# Patient Record
Sex: Female | Born: 2000 | Race: White | Hispanic: No | Marital: Single | Smoking: Former smoker
Health system: Southern US, Community
[De-identification: ages and names within clinical notes are randomized; demographics above are authoritative.]

## PROBLEM LIST (undated history)

## (undated) DIAGNOSIS — F419 Anxiety disorder, unspecified: Secondary | ICD-10-CM

## (undated) DIAGNOSIS — J45909 Unspecified asthma, uncomplicated: Secondary | ICD-10-CM

## (undated) DIAGNOSIS — F4310 Post-traumatic stress disorder, unspecified: Secondary | ICD-10-CM

## (undated) DIAGNOSIS — F909 Attention-deficit hyperactivity disorder, unspecified type: Secondary | ICD-10-CM

## (undated) HISTORY — PX: WISDOM TOOTH EXTRACTION: SHX21

---

## 2020-07-12 ENCOUNTER — Other Ambulatory Visit: Payer: Self-pay

## 2020-07-12 ENCOUNTER — Emergency Department
Admission: EM | Admit: 2020-07-12 | Discharge: 2020-07-12 | Disposition: A | Discharge status: Home / Self Care | Payer: BC Managed Care – PPO | Attending: Emergency Medicine | Admitting: Emergency Medicine

## 2020-07-12 DIAGNOSIS — J0100 Acute maxillary sinusitis, unspecified: Secondary | ICD-10-CM | POA: Insufficient documentation

## 2020-07-12 DIAGNOSIS — R059 Cough, unspecified: Secondary | ICD-10-CM | POA: Diagnosis present

## 2020-07-12 MED ORDER — DOXYCYCLINE HYCLATE 50 MG PO CAPS: 100.0000 mg | ORAL_CAPSULE | Freq: Two times a day (BID) | ORAL | 0 refills | Status: AC

## 2020-07-12 NOTE — ED Provider Notes (Signed)
[facility] Emergency Department Provider Note  ____________________________________________  Time seen: Approximately 5:53 PM  I have reviewed the triage vital signs and the nursing notes.   HISTORY  Chief Complaint Cough and Facial Pain    HPI Rita Dunn is a 19 y.o. female that presents to the emergency department for evaluation of right-sided forehead and cheek pain, nasal congestion, sore throat, nonproductive cough.  Patient states that cough and nasal congestion has been present for about 1 month.  Her facial pain started today.  She tested negative for Covid today.  No fevers, headache, neck pain, shortness of breath, chest pain.   No past medical history on file.  There are no problems to display for this patient.    Prior to Admission medications   Medication Sig Start Date End Date Taking? Authorizing Provider  doxycycline (VIBRAMYCIN) 50 MG capsule Take 2 capsules (100 mg total) by mouth 2 (two) times daily for 10 days. 07/12/20 07/22/20  Enid Derry, PA-C    Allergies Penicillins  No family history on file.  Social History Social History   Tobacco Use  . Smoking status: Not on file  Substance Use Topics  . Alcohol use: Not on file  . Drug use: Not on file     Review of Systems  Constitutional: No fever/chills Eyes: No visual changes. No discharge. ENT: Positive for congestion and rhinorrhea.  Positive for facial pain. Cardiovascular: No chest pain. Respiratory: Positive for cough. No SOB. Gastrointestinal: No abdominal pain.  No nausea, no vomiting.  No diarrhea.  No constipation. Musculoskeletal: Negative for musculoskeletal pain. Skin: Negative for rash, abrasions, lacerations, ecchymosis. Neurological: Negative for headaches.   ____________________________________________   PHYSICAL EXAM:  VITAL SIGNS: ED Triage Vitals  Enc Vitals Group     BP 07/12/20 1514 112/63     Pulse Rate 07/12/20 1514 79     Resp  07/12/20 1514 16     Temp 07/12/20 1514 98.9 F (37.2 C)     Temp Source 07/12/20 1514 Oral     SpO2 07/12/20 1514 99 %     Weight 07/12/20 1515 120 lb (54.4 kg)     Height 07/12/20 1515 5\' 6"  (1.676 m)     Head Circumference --      Peak Flow --      Pain Score 07/12/20 1515 6     Pain Loc --      Pain Edu? --      Excl. in GC? --      Constitutional: Alert and oriented. Well appearing and in no acute distress. Eyes: Conjunctivae are normal. PERRL. EOMI. No discharge. Head: Atraumatic. ENT: Right frontal and maxillary sinus tenderness.      Ears: Tympanic membranes pearly gray with good landmarks. No discharge.      Nose: Mild congestion/rhinnorhea.      Mouth/Throat: Mucous membranes are moist. Oropharynx non-erythematous. Tonsils not enlarged. No exudates. Uvula midline. Neck: No stridor.   Hematological/Lymphatic/Immunilogical: No cervical lymphadenopathy. Cardiovascular: Normal rate, regular rhythm.  Good peripheral circulation. Respiratory: Normal respiratory effort without tachypnea or retractions. Lungs CTAB. Good air entry to the bases with no decreased or absent breath sounds. Gastrointestinal: Bowel sounds 4 quadrants. Soft and nontender to palpation. No guarding or rigidity. No palpable masses. No distention. Musculoskeletal: Full range of motion to all extremities. No gross deformities appreciated. Neurologic:  Normal speech and language. No gross focal neurologic deficits are appreciated.  Skin:  Skin is warm, dry and intact. No rash noted.  Psychiatric: Mood and affect are normal. Speech and behavior are normal. Patient exhibits appropriate insight and judgement.   ____________________________________________   LABS (all labs ordered are listed, but only abnormal results are displayed)  Labs Reviewed - No data to display ____________________________________________  EKG   ____________________________________________  RADIOLOGY   No results  found.  ____________________________________________    PROCEDURES  Procedure(s) performed:    Procedures    Medications - No data to display   ____________________________________________   INITIAL IMPRESSION / ASSESSMENT AND PLAN / ED COURSE  Pertinent labs & imaging results that were available during my care of the patient were reviewed by me and considered in my medical decision making (see chart for details).  Review of the [place] CSRS was performed in accordance of the [facility] prior to dispensing any controlled drugs.     Patient's diagnosis is consistent with sinusitis Vital signs and exam are reassuring.  Patient feels comfortable going home. Patient will be discharged home with prescriptions for doxycycline. Patient is to follow up with PCP as needed or otherwise directed. Patient is given ED precautions to return to the ED for any worsening or new symptoms.   Rita Dunn was evaluated in Emergency Department on 07/12/2020 for the symptoms described in the history of present illness. She was evaluated in the context of the global COVID-19 pandemic, which necessitated consideration that the patient might be at risk for infection with the SARS-CoV-2 virus that causes COVID-19. Institutional protocols and algorithms that pertain to the evaluation of patients at risk for COVID-19 are in a state of rapid change based on information released by regulatory bodies including the CDC and federal and state organizations. These policies and algorithms were followed during the patient's care in the ED.  ____________________________________________  FINAL CLINICAL IMPRESSION(S) / ED DIAGNOSES  Final diagnoses:  Acute non-recurrent maxillary sinusitis      NEW MEDICATIONS STARTED DURING THIS VISIT:  ED Discharge Orders         Ordered    doxycycline (VIBRAMYCIN) 50 MG capsule  2 times daily        07/12/20 1750              This chart was dictated using voice recognition  software/Dragon. Despite best efforts to proofread, errors can occur which can change the meaning. Any change was purely unintentional.    Enid Derry, PA-C 07/12/20 2250    Willy Eddy, MD 07/17/20 [phone]

## 2020-07-12 NOTE — ED Triage Notes (Signed)
Pt here with cough that started a month ago. Pt also states that she has facial pain that started today. Pt went to her health center at [facility] and was sent here to be seen. Pt also has been having N/V. Pt denies being around with covid and tested negative today as well. Pt NAD in triage.

## 2020-07-12 NOTE — ED Triage Notes (Signed)
Pt called for triage, no response. 

## 2020-11-12 ENCOUNTER — Emergency Department
Admission: EM | Admit: 2020-11-12 | Discharge: 2020-11-12 | Disposition: A | Discharge status: Home / Self Care | Payer: BC Managed Care – PPO | Attending: Emergency Medicine | Admitting: Emergency Medicine

## 2020-11-12 ENCOUNTER — Other Ambulatory Visit: Payer: Self-pay

## 2020-11-12 ENCOUNTER — Emergency Department: Payer: BC Managed Care – PPO

## 2020-11-12 DIAGNOSIS — R1310 Dysphagia, unspecified: Secondary | ICD-10-CM | POA: Insufficient documentation

## 2020-11-12 DIAGNOSIS — R221 Localized swelling, mass and lump, neck: Secondary | ICD-10-CM | POA: Diagnosis present

## 2020-11-12 DIAGNOSIS — J039 Acute tonsillitis, unspecified: Secondary | ICD-10-CM

## 2020-11-12 DIAGNOSIS — J45909 Unspecified asthma, uncomplicated: Secondary | ICD-10-CM | POA: Diagnosis not present

## 2020-11-12 HISTORY — DX: Unspecified asthma, uncomplicated: J45.909

## 2020-11-12 LAB — CHLAMYDIA/NGC RT PCR (ARMC ONLY)
Chlamydia Tr: NOT DETECTED
N gonorrhoeae: NOT DETECTED

## 2020-11-12 LAB — CBC WITH DIFFERENTIAL/PLATELET
Abs Immature Granulocytes: 0.02 10*3/uL (ref 0.00–0.07)
Basophils Absolute: 0.1 10*3/uL (ref 0.0–0.1)
Basophils Relative: 1 %
Eosinophils Absolute: 0.2 10*3/uL (ref 0.0–0.5)
Eosinophils Relative: 2 %
HCT: 42.2 % (ref 36.0–46.0)
Hemoglobin: 14.1 g/dL (ref 12.0–15.0)
Immature Granulocytes: 0 %
Lymphocytes Relative: 35 %
Lymphs Abs: 3.2 10*3/uL (ref 0.7–4.0)
MCH: 29.3 pg (ref 26.0–34.0)
MCHC: 33.4 g/dL (ref 30.0–36.0)
MCV: 87.6 fL (ref 80.0–100.0)
Monocytes Absolute: 0.7 10*3/uL (ref 0.1–1.0)
Monocytes Relative: 8 %
Neutro Abs: 5 10*3/uL (ref 1.7–7.7)
Neutrophils Relative %: 54 %
Platelets: 199 10*3/uL (ref 150–400)
RBC: 4.82 MIL/uL (ref 3.87–5.11)
RDW: 12.9 % (ref 11.5–15.5)
WBC: 9.1 10*3/uL (ref 4.0–10.5)
nRBC: 0 % (ref 0.0–0.2)

## 2020-11-12 LAB — COMPREHENSIVE METABOLIC PANEL
ALT: 20 U/L (ref 0–44)
AST: 21 U/L (ref 15–41)
Albumin: 4.8 g/dL (ref 3.5–5.0)
Alkaline Phosphatase: 61 U/L (ref 38–126)
Anion gap: 11 (ref 5–15)
BUN: 10 mg/dL (ref 6–20)
CO2: 23 mmol/L (ref 22–32)
Calcium: 9.7 mg/dL (ref 8.9–10.3)
Chloride: 103 mmol/L (ref 98–111)
Creatinine, Ser: 0.67 mg/dL (ref 0.44–1.00)
GFR, Estimated: >60 mL/min (ref >60)
Glucose, Bld: 93 mg/dL (ref 70–99)
Potassium: 3.7 mmol/L (ref 3.5–5.1)
Sodium: 137 mmol/L (ref 135–145)
Total Bilirubin: 0.6 mg/dL (ref 0.3–1.2)
Total Protein: 8.6 g/dL — ABNORMAL HIGH (ref 6.5–8.1)

## 2020-11-12 LAB — GROUP A STREP BY PCR: Group A Strep by PCR: NOT DETECTED

## 2020-11-12 LAB — MONONUCLEOSIS SCREEN: Mono Screen: NEGATIVE

## 2020-11-12 LAB — POC URINE PREG, ED: Preg Test, Ur: NEGATIVE

## 2020-11-12 IMAGING — CT CT NECK W/ CM
3 of 4 series · 14 of 35 positions shown, 17 images · IV contrast (omnipaque)
Comparison: None.

CLINICAL DATA: Neck abscess.  Swollen tonsils.

EXAM:
CT NECK WITH CONTRAST
TECHNIQUE: Multidetector CT imaging of the neck was performed using the
standard protocol following the bolus administration of intravenous
contrast.
CONTRAST:  75mL OMNIPAQUE IOHEXOL 300 MG/ML  SOLN

[Series 5: sag neck · sagittal · 0.44mm/px · 5 of 104 slices shown, 6 images]
[im 35/104  bone]
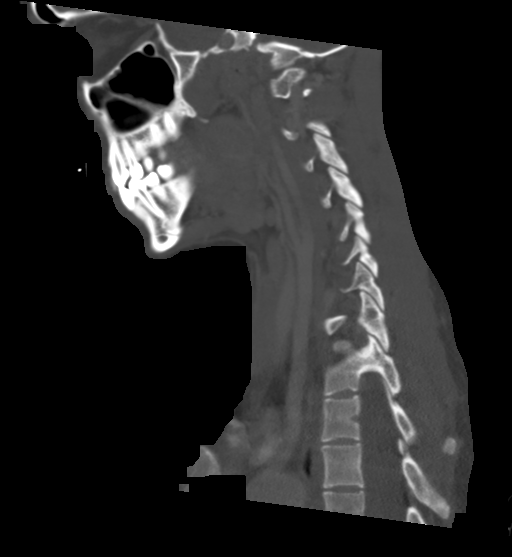
[im 43/104  bone]
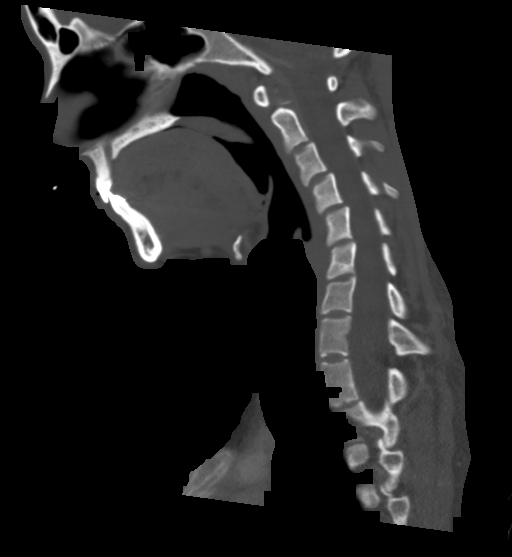
[im 52/104  soft-tissue]
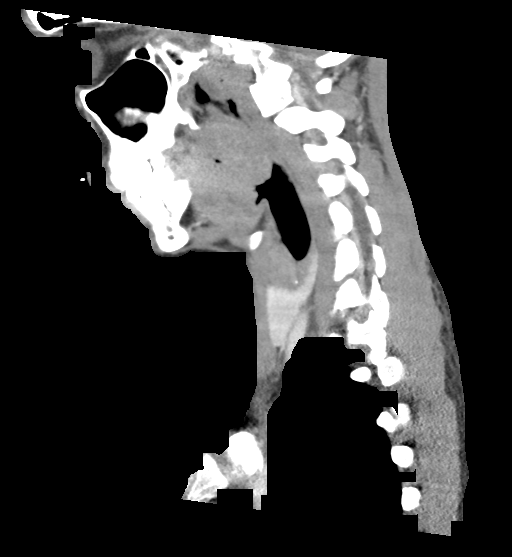
[im 52/104  bone]
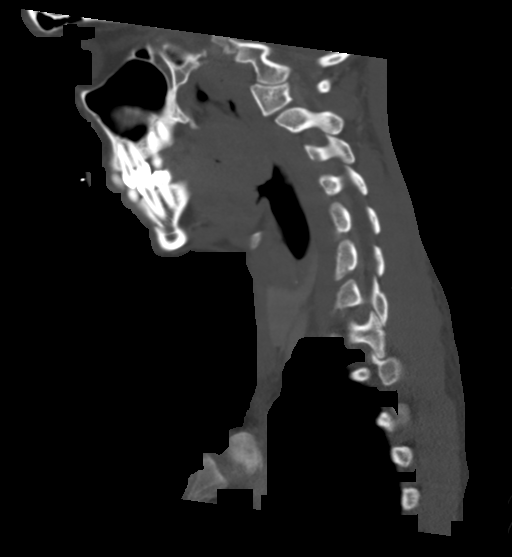
[im 61/104  bone]
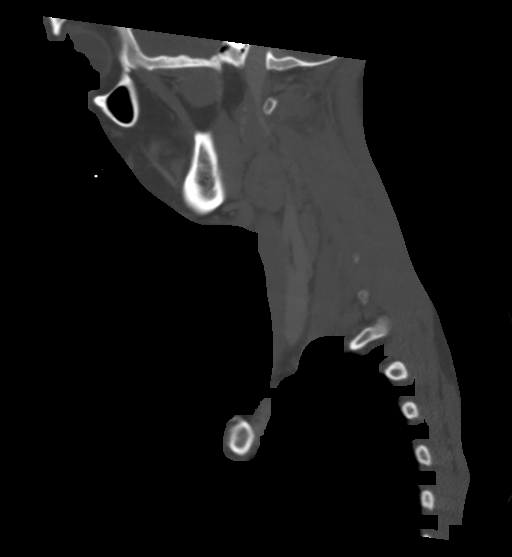
[im 69/104  bone]
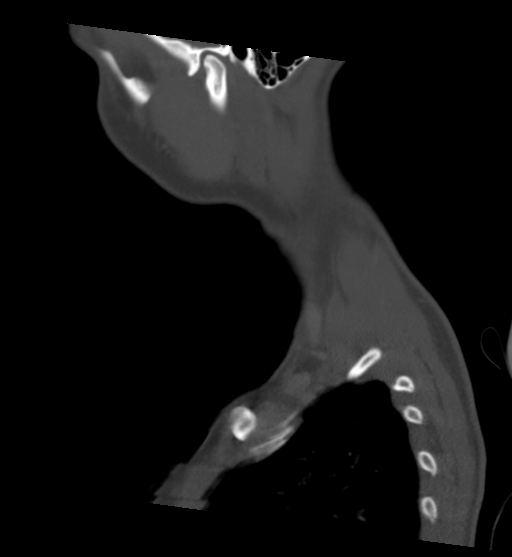

[Series 6: cor neck · coronal · 0.42mm/px · 3 of 120 slices shown]
[im 24/120  bone]
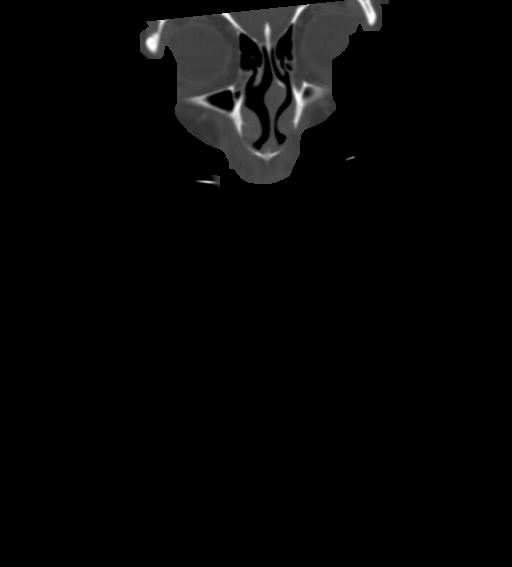
[im 48/120  bone]
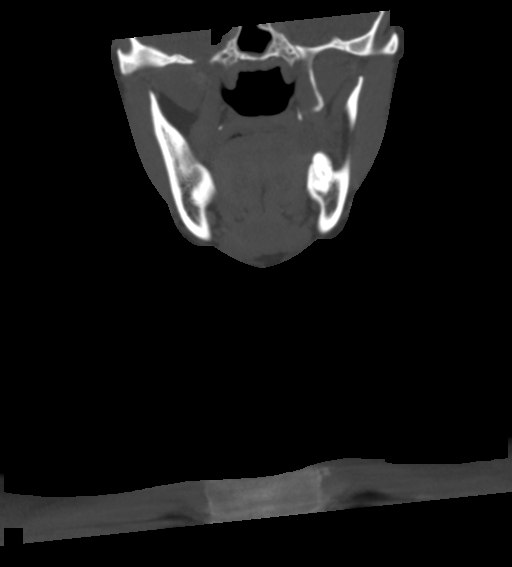
[im 72/120  bone]
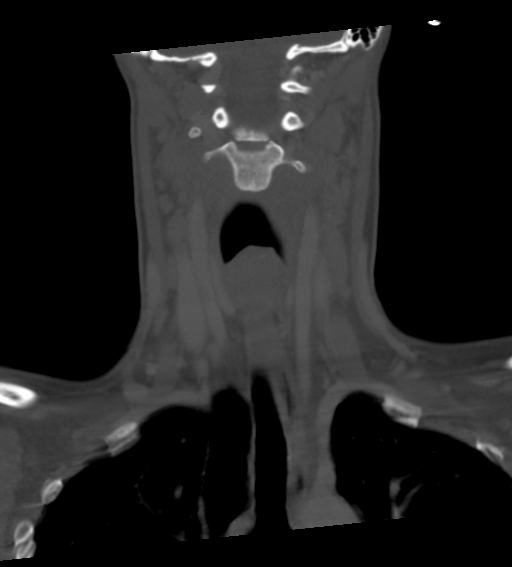

[Series 7: orthogonal ax · axial · 0.33mm/px · z∈[-285,-113]mm · 6 of 124 slices shown, 8 images]
[im 18/124  soft-tissue]
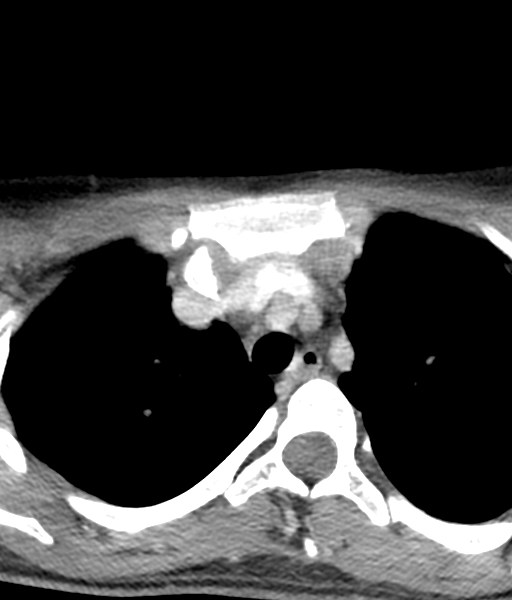
[im 18/124  bone]
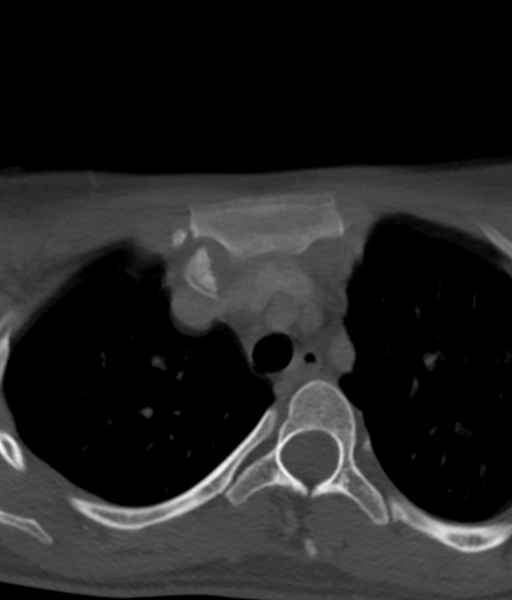
[im 36/124  bone]
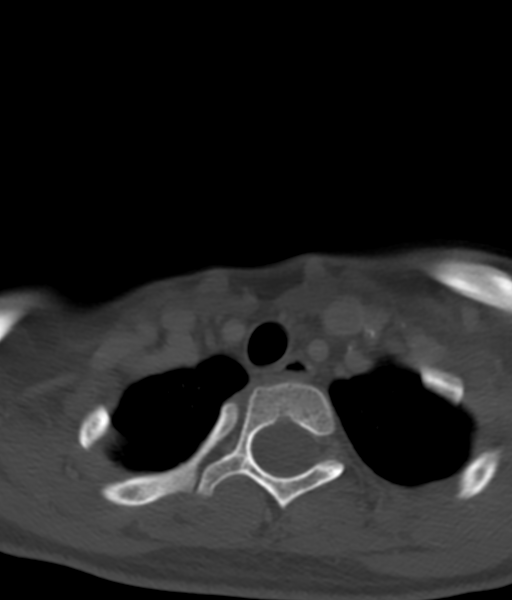
[im 53/124  bone]
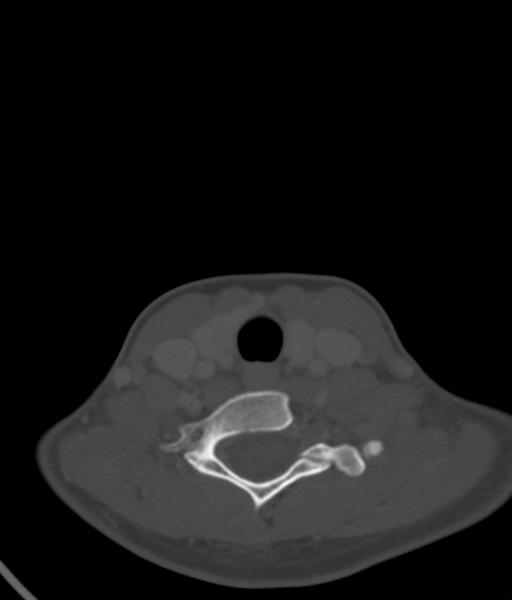
[im 71/124  bone]
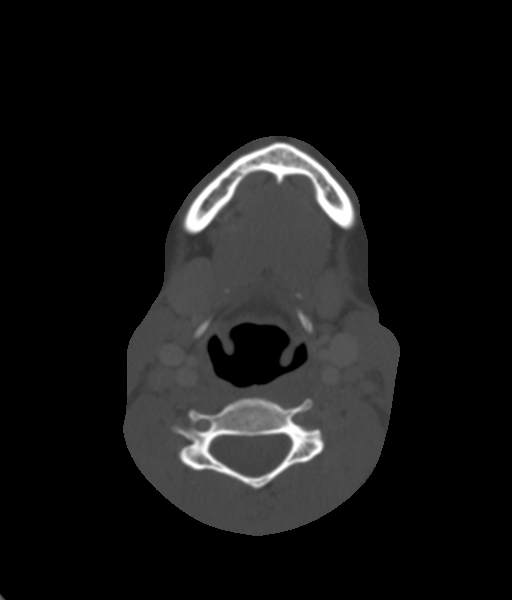
[im 88/124  soft-tissue]
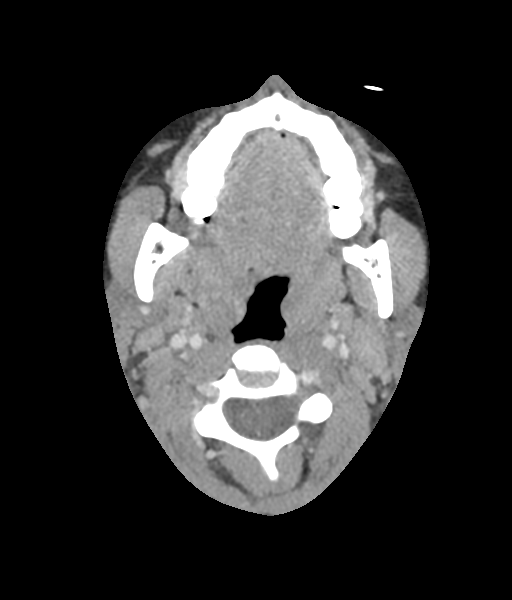
[im 88/124  bone]
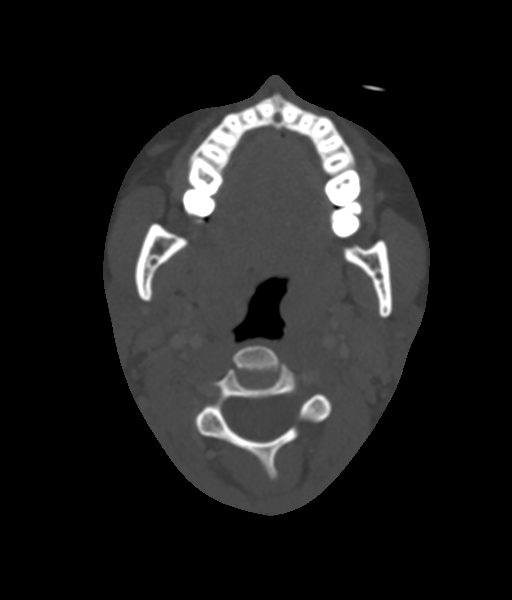
[im 106/124  bone]
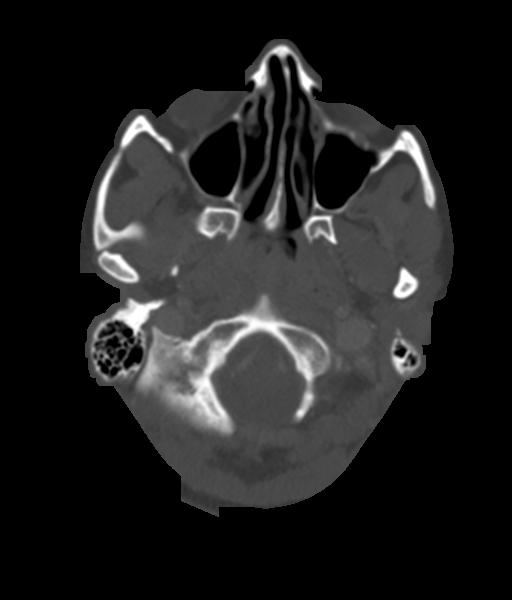

[14 of 35 positions shown; findings below may reference images not displayed]

FINDINGS: Pharynx and larynx: Moderate bilateral tonsillar prominence that
could go along with mild tonsillitis. No advanced inflammatory
change. No tonsillar or peritonsillar abscess. No other mucosal or
submucosal finding.

Salivary glands: Parotid and submandibular glands are normal.

Thyroid: Normal

Lymph nodes: Prominent level 2 nodes on both sides likely reactive
to tonsillar inflammation. No suppuration.

Vascular: Normal

Limited intracranial: Normal

Visualized orbits: Normal

Mastoids and visualized paranasal sinuses: Clear

Skeleton: Normal

Upper chest: Normal

Other: None
IMPRESSION: Moderate bilateral tonsillar prominence that could go along with
mild tonsillitis. No advanced inflammatory change. No tonsillar or
peritonsillar abscess. Prominent level 2 nodes on both sides likely
reactive to tonsillar inflammation. No suppuration.

## 2020-11-12 MED ORDER — AZITHROMYCIN 250 MG PO TABS
250.0000 mg | ORAL_TABLET | Freq: Every day | ORAL | 0 refills | Status: AC
Start: 2020-11-13 — End: 2020-11-17

## 2020-11-12 MED ORDER — DEXAMETHASONE SODIUM PHOSPHATE 10 MG/ML IJ SOLN
10.0000 mg | Freq: Once | INTRAMUSCULAR | Status: AC
  Administered 2020-11-12: 10 mg via INTRAVENOUS
  Filled 2020-11-12: qty 1

## 2020-11-12 MED ORDER — HYDROXYZINE HCL 25 MG PO TABS
25.0000 mg | ORAL_TABLET | Freq: Once | ORAL | Status: AC
  Administered 2020-11-12: 25 mg via ORAL
  Filled 2020-11-12: qty 1

## 2020-11-12 MED ORDER — AZITHROMYCIN 500 MG PO TABS
500.0000 mg | ORAL_TABLET | Freq: Once | ORAL | Status: AC
  Administered 2020-11-12: 500 mg via ORAL
  Filled 2020-11-12: qty 1

## 2020-11-12 MED ORDER — PREDNISONE 20 MG PO TABS: 20.0000 mg | ORAL_TABLET | Freq: Two times a day (BID) | ORAL | 0 refills | Status: AC

## 2020-11-12 MED ORDER — IOHEXOL 300 MG/ML  SOLN
75.0000 mL | Freq: Once | INTRAMUSCULAR | Status: AC | PRN
  Administered 2020-11-12: 75 mL via INTRAVENOUS
  Filled 2020-11-12: qty 75

## 2020-11-12 NOTE — ED Notes (Signed)
Patient returned to room from CT. 

## 2020-11-12 NOTE — ED Notes (Signed)
Patient to CT.

## 2020-11-12 NOTE — ED Notes (Signed)
ED Provider at bedside. 

## 2020-11-12 NOTE — ED Triage Notes (Signed)
Pt presents via POV c/o sore throat for a couple of days. Reports "tonsil swelling".

## 2020-11-12 NOTE — Discharge Instructions (Addendum)
Your CT scan was reassuring as it did not show any abscess related to your tonsillitis.  You do have bilateral tonsillitis which would be treated with both antibiotics and steroids.  You have a mono test pending at the time of this disposition and you may follow that result on [facility] MyChart.  Take the antibiotic and steroid as directed until all pills are completed.  Avoid taking any over-the-counter anti-inflammatories until the steroids are completed.  Continue to hydrate to prevent dehydration.  Follow-up with your primary provider for ongoing symptoms.

## 2020-11-12 NOTE — ED Notes (Signed)
Patient is resting comfortably. 

## 2020-11-12 NOTE — ED Notes (Signed)
Patient updated on POC. Patient denies concerns, complaints, or questions at this time.

## 2020-11-12 NOTE — ED Provider Notes (Signed)
[facility] Emergency Department Provider Note  ____________________________________________   Event Date/Time   First MD Initiated Contact with Patient 11/12/20 1748     (approximate)  I have reviewed the triage vital signs and the nursing notes.   HISTORY  Chief Complaint Sore Throat  HPI Rita Dunn is a 20 y.o. female reports to the emergency department for evaluation of feeling like her tonsil was swollen.  Patient states that she was seen 3 to 4 weeks ago by an outpatient provider and states that she was not tested, but was treated for strep throat.  She states that she only took 3 doses of the antibiotic because she was concerned about potential for allergy given that she has been told to avoid penicillins.  She is not sure the exact antibiotic that she was initially prescribed, but admits that she did not take anything other than the 3 initial doses.  She states that after that time, she was asymptomatic until 3 days ago, when she developed swelling on the right side of her throat.  She states that she has difficulty swallowing food, and it feels like there is something blocking her throat from breathing.  She denies any current pain in her throat, denies fevers, states that she otherwise feels well.  She denies any voice changes.  Has tried Tylenol at home without any relief.         Past Medical History:  Diagnosis Date  . Asthma     There are no problems to display for this patient.   History reviewed. No pertinent surgical history.  Prior to Admission medications   Not on File    Allergies Penicillins  History reviewed. No pertinent family history.  Social History Social History   Tobacco Use  . Smoking status: Never Smoker  . Smokeless tobacco: Never Used  Substance Use Topics  . Alcohol use: Never  . Drug use: Never    Review of Systems Constitutional: No fever/chills Eyes: No visual changes. ENT: + Throat  swelling Cardiovascular: Denies chest pain. Respiratory: Denies shortness of breath. Gastrointestinal: No abdominal pain.  No nausea, no vomiting.  No diarrhea.  No constipation. Genitourinary: Negative for dysuria. Musculoskeletal: Negative for back pain. Skin: Negative for rash. Neurological: Negative for headaches, focal weakness or numbness.   ____________________________________________   PHYSICAL EXAM:  VITAL SIGNS: ED Triage Vitals  Enc Vitals Group     BP 11/12/20 1736 127/77     Pulse Rate 11/12/20 1736 92     Resp 11/12/20 1736 16     Temp 11/12/20 1736 98.2 F (36.8 C)     Temp Source 11/12/20 1736 Oral     SpO2 11/12/20 1736 99 %     Weight 11/12/20 1804 121 lb 4.1 oz (55 kg)     Height 11/12/20 1804 5\' 6"  (1.676 m)     Head Circumference --      Peak Flow --      Pain Score 11/12/20 1729 0     Pain Loc --      Pain Edu? --      Excl. in GC? --    Constitutional: Alert and oriented. Well appearing and in no acute distress. Eyes: Conjunctivae are normal. PERRL. EOMI. Head: Atraumatic. Nose: No congestion/rhinnorhea. Mouth/Throat: Mucous membranes are moist.  Oropharynx is erythematous with bilateral tonsillar exudates.  The left tonsil appears 1+, right tonsil appears 3+ with a uvula shift towards the left.  Airway remains patent.  Voice sounds  are normal. Neck: No stridor.  There is tenderness to palpation of the right submandibular area.  Lymphatic: There is right-sided cervical lymphadenopathy, but none on the left Cardiovascular: Normal rate, regular rhythm. Grossly normal heart sounds.  Good peripheral circulation. Respiratory: Normal respiratory effort.  No retractions. Lungs CTAB. Gastrointestinal: Soft and nontender. No distention.  Neurologic:  Normal speech and language. No gross focal neurologic deficits are appreciated. No gait instability. Skin:  Skin is warm, dry and intact. No rash noted. Psychiatric: Mood and affect are normal. Speech and  behavior are normal.  ____________________________________________   LABS (all labs ordered are listed, but only abnormal results are displayed)  Labs Reviewed  GROUP A STREP BY PCR  CHLAMYDIA/NGC RT PCR ([facility] ONLY)  CBC WITH DIFFERENTIAL/PLATELET  COMPREHENSIVE METABOLIC PANEL  POC URINE PREG, ED   ____________________________________________   INITIAL IMPRESSION / ASSESSMENT AND PLAN / ED COURSE  As part of my medical decision making, I reviewed the following data within the electronic MEDICAL RECORD NUMBER Nursing notes reviewed and incorporated        Patient is a 20 year old female who presents to the emergency department today for evaluation of right-sided throat swelling that has been present for 3 days.  She had what sounds to be incompletely treated strep pharyngitis approximately 3 to 4 weeks ago.  She denies any throat pain, denies any voice changes, but does report feeling like there is something blocking her from breathing and increasing difficulty swallowing food.  See HPI for further details.  In triage, the patient has normal vital signs, is not febrile or tachycardic.  On physical exam, she does have a notable unilateral tonsillar swelling with exudate as well as a uvular shift to the left.  Will initiate further evaluation with labs, strep and GC/chlamydia swabs of the throat, as well as CT with contrast of the soft tissues of the neck.  At this time, the patient is being signed out to [facility], PA-C pending laboratory evaluation and CT.      ____________________________________________   FINAL CLINICAL IMPRESSION(S) / ED DIAGNOSES  Final diagnoses:  None     ED Discharge Orders    None      *Please note:  Rita Dunn was evaluated in Emergency Department on 11/12/2020 for the symptoms described in the history of present illness. She was evaluated in the context of the global COVID-19 pandemic, which necessitated consideration that the patient  might be at risk for infection with the SARS-CoV-2 virus that causes COVID-19. Institutional protocols and algorithms that pertain to the evaluation of patients at risk for COVID-19 are in a state of rapid change based on information released by regulatory bodies including the CDC and federal and state organizations. These policies and algorithms were followed during the patient's care in the ED.  Some ED evaluations and interventions may be delayed as a result of limited staffing during and the pandemic.*   Note:  This document was prepared using Dragon voice recognition software and may include unintentional dictation errors.   Lucy Chris, PA 11/12/20 Teola Bradley, MD 11/12/20 [phone]

## 2020-11-12 NOTE — ED Notes (Signed)
Patient reports redness, and swelling to throat x several days. Patient reports some SOB and pain with swallowing. Patient reports nausea, but states she has nausea intermittently at baseline.

## 2020-12-13 ENCOUNTER — Emergency Department: Payer: BC Managed Care – PPO

## 2020-12-13 ENCOUNTER — Emergency Department
Admission: EM | Admit: 2020-12-13 | Discharge: 2020-12-13 | Disposition: A | Discharge status: Home / Self Care | Payer: BC Managed Care – PPO | Attending: Emergency Medicine | Admitting: Emergency Medicine

## 2020-12-13 ENCOUNTER — Other Ambulatory Visit: Payer: Self-pay

## 2020-12-13 DIAGNOSIS — J45909 Unspecified asthma, uncomplicated: Secondary | ICD-10-CM | POA: Insufficient documentation

## 2020-12-13 DIAGNOSIS — J039 Acute tonsillitis, unspecified: Secondary | ICD-10-CM | POA: Insufficient documentation

## 2020-12-13 DIAGNOSIS — J029 Acute pharyngitis, unspecified: Secondary | ICD-10-CM | POA: Diagnosis present

## 2020-12-13 LAB — MONONUCLEOSIS SCREEN: Mono Screen: NEGATIVE

## 2020-12-13 LAB — GROUP A STREP BY PCR: Group A Strep by PCR: NOT DETECTED

## 2020-12-13 IMAGING — CR DG NECK SOFT TISSUE
1 series · 2 of 2 positions shown · non-contrast
Comparison: None.

CLINICAL DATA: Dysphagia

EXAM:
NECK SOFT TISSUES - 1+ VIEW

[Series 1: dg neck soft tissue · 0.14mm/px · 2 of 2 slices shown]
[im 1/2]
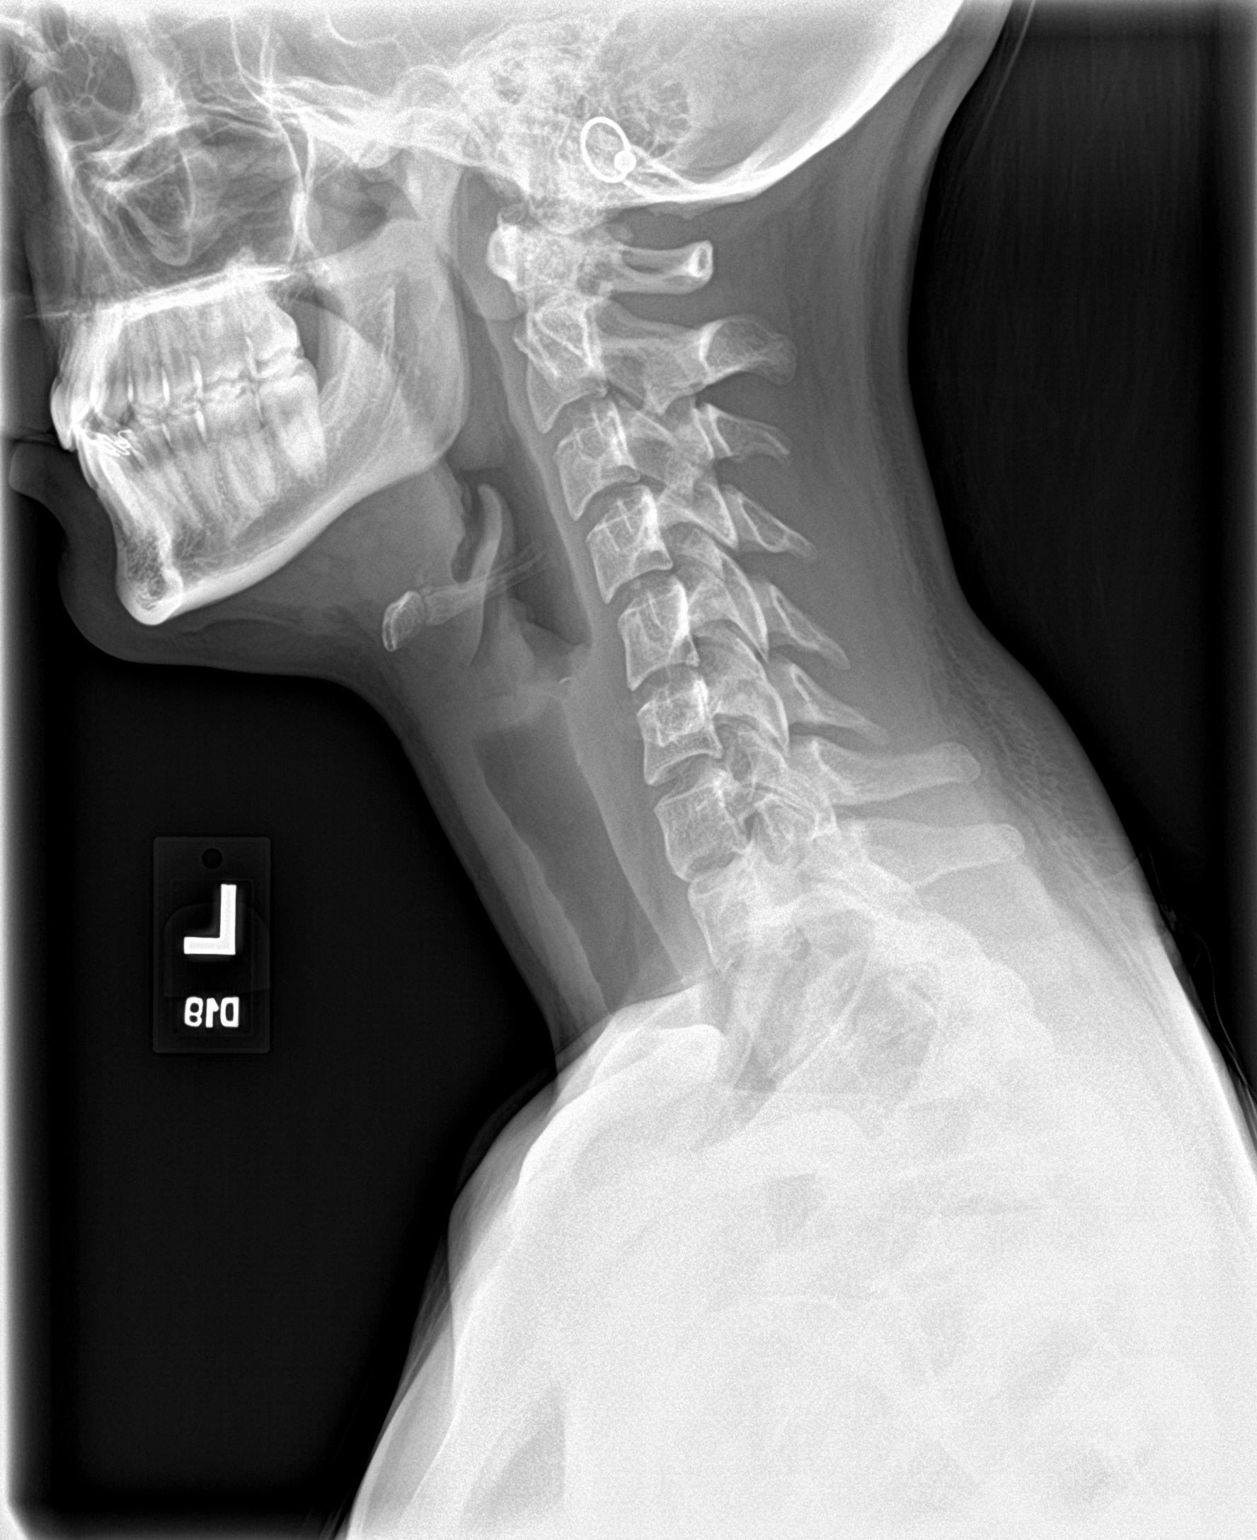
[im 2/2]
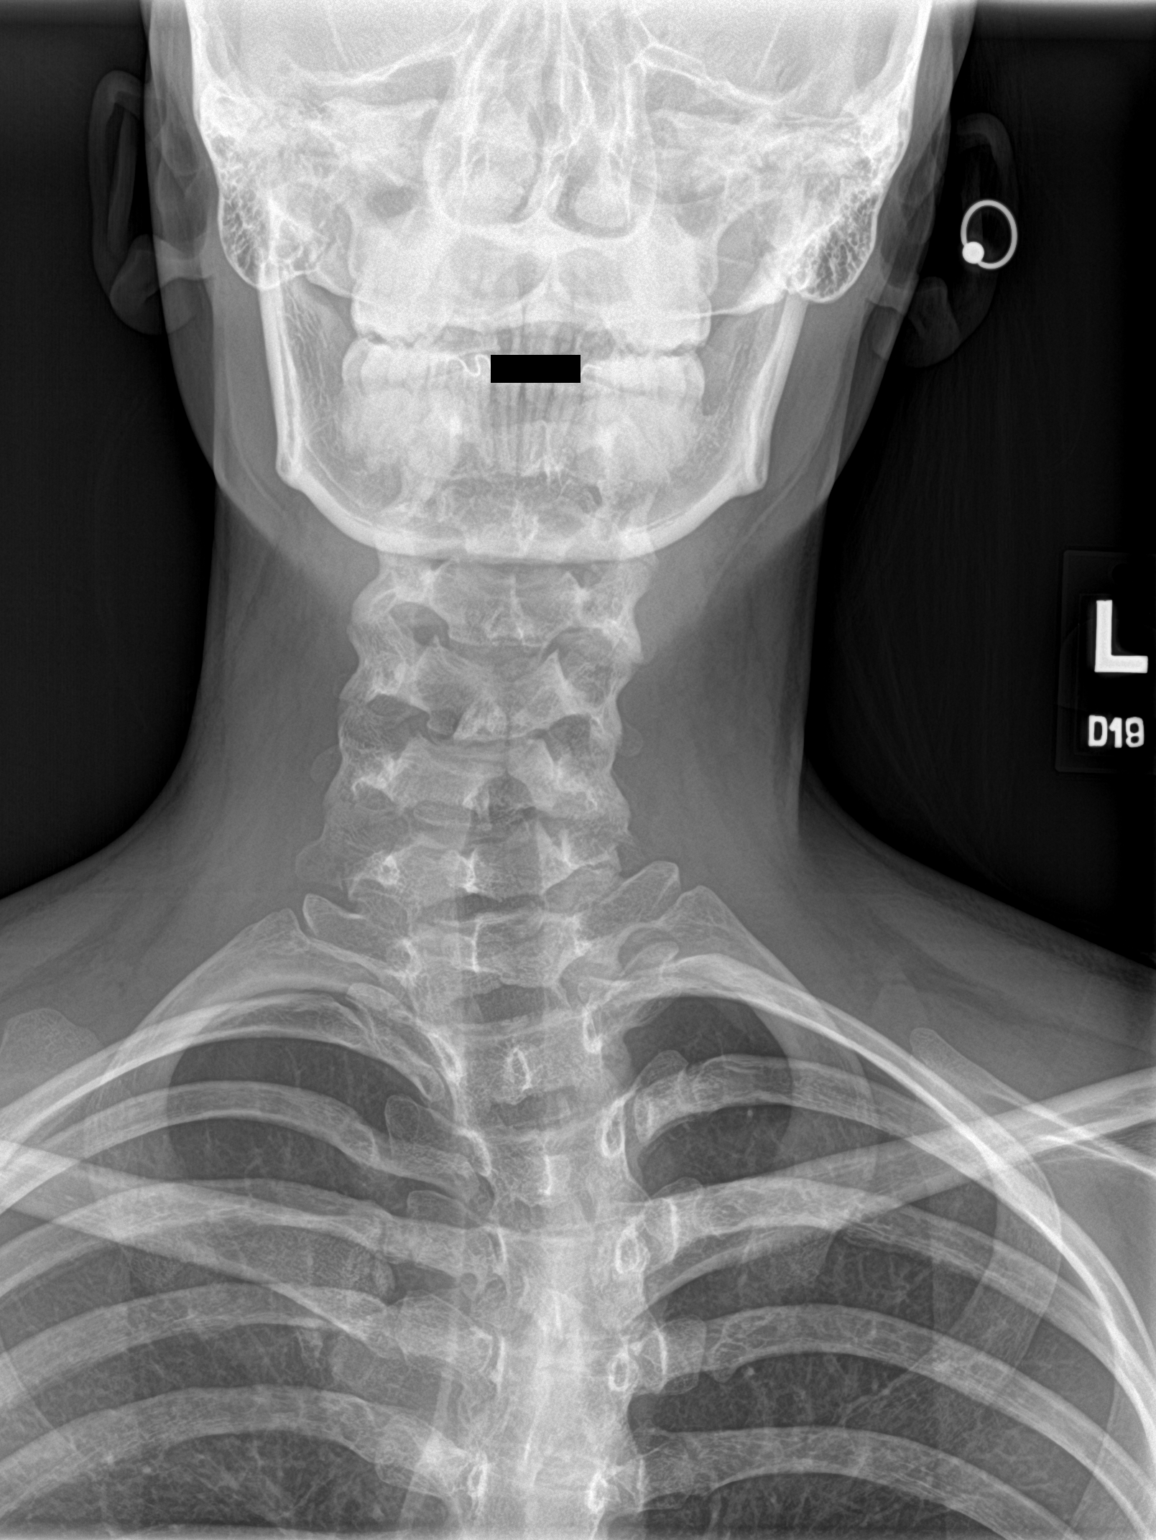

[2 of 2 positions shown; findings below may reference images not displayed]

FINDINGS: Frontal and lateral views were obtained. The epiglottis and
aryepiglottic folds appear normal. There is localized subglottic
narrowing of the trachea extending over a distance of approximately
1.2 cm. The remainder of the trachea appears normal. Prevertebral
soft tissues are normal. No air-fluid level to suggest abscess.
Tongue base region appears normal. No bony abnormality. Visualized
upper lungs are clear. Note that there is upper thoracic
levoscoliosis.
IMPRESSION: Localized subglottic narrowing of the trachea. This appearance
raises question of potential croup. Remainder of the trachea appears
normal. The epiglottis and aryepiglottic folds appear normal. Other
soft tissues appear unremarkable. Visualized upper lung regions are
clear.

## 2020-12-13 MED ORDER — HYDROXYZINE HCL 25 MG PO TABS
25.0000 mg | ORAL_TABLET | Freq: Once | ORAL | Status: AC
  Administered 2020-12-13: 25 mg via ORAL
  Filled 2020-12-13: qty 1

## 2020-12-13 MED ORDER — PSEUDOEPH-BROMPHEN-DM 30-2-10 MG/5ML PO SYRP: 5.0000 mL | ORAL_SOLUTION | Freq: Four times a day (QID) | ORAL | 0 refills | Status: DC | PRN

## 2020-12-13 MED ORDER — LIDOCAINE VISCOUS HCL 2 % MT SOLN: 5.0000 mL | Freq: Four times a day (QID) | OROMUCOSAL | 0 refills | Status: DC | PRN

## 2020-12-13 MED ORDER — METHYLPREDNISOLONE SODIUM SUCC 125 MG IJ SOLR
125.0000 mg | Freq: Once | INTRAMUSCULAR | Status: AC
  Administered 2020-12-13: 125 mg via INTRAMUSCULAR
  Filled 2020-12-13: qty 2

## 2020-12-13 MED ORDER — HYDROXYZINE HCL 25 MG PO TABS
25.0000 mg | ORAL_TABLET | Freq: Three times a day (TID) | ORAL | 0 refills | Status: DC | PRN
Start: 2020-12-13 — End: 2020-12-16

## 2020-12-13 MED ORDER — METHYLPREDNISOLONE 4 MG PO TBPK: ORAL_TABLET | ORAL | 0 refills | Status: DC

## 2020-12-13 NOTE — ED Provider Notes (Signed)
[facility] Emergency Department Provider Note   ____________________________________________   Event Date/Time   First MD Initiated Contact with Patient 12/13/20 1158     (approximate)  I have reviewed the triage vital signs and the nursing notes.   HISTORY  Chief Complaint Sore Throat    HPI Rita Dunn is a 20 y.o. female patient presents with sore throat.  Patient complains of throat swelling.  Patient seen a month ago with same complaint.  Patient was negative for strep pharyngitis and mononucleosis.  Patient states she was treated with steroids and felt better until 2 days ago.         Past Medical History:  Diagnosis Date  . Asthma     There are no problems to display for this patient.   History reviewed. No pertinent surgical history.  Prior to Admission medications   Medication Sig Start Date End Date Taking? Authorizing Provider  brompheniramine-pseudoephedrine-DM 30-2-10 MG/5ML syrup Take 5 mLs by mouth 4 (four) times daily as needed. Mix with 5 mL of viscous swallows lidocaine for swish and swallow. 12/13/20  Yes Joni Reining, PA-C  lidocaine (XYLOCAINE) 2 % solution Use as directed 5 mLs in the mouth or throat every 6 (six) hours as needed for mouth pain. Mix with 5 mL of Bromfed-DM for swish and swallow. 12/13/20  Yes Joni Reining, PA-C  methylPREDNISolone (MEDROL DOSEPAK) 4 MG TBPK tablet Take Tapered dose as directed starting tomorrow morning. 12/13/20  Yes Joni Reining, PA-C    Allergies Penicillins  History reviewed. No pertinent family history.  Social History Social History   Tobacco Use  . Smoking status: Never Smoker  . Smokeless tobacco: Never Used  Substance Use Topics  . Alcohol use: Never  . Drug use: Never    Review of Systems Constitutional: No fever/chills Eyes: No visual changes. ENT: Sore throat. Cardiovascular: Denies chest pain. Respiratory: Denies shortness of breath. Gastrointestinal: No  abdominal pain.  No nausea, no vomiting.  No diarrhea.  No constipation. Genitourinary: Negative for dysuria. Musculoskeletal: Negative for back pain. Skin: Negative for rash. Neurological: Negative for headaches, focal weakness or numbness. Allergic/Immunilogical: Penicillin ____________________________________________   PHYSICAL EXAM:  VITAL SIGNS: ED Triage Vitals  Enc Vitals Group     BP 12/13/20 1149 102/68     Pulse Rate 12/13/20 1149 80     Resp 12/13/20 1149 20     Temp 12/13/20 1149 98.4 F (36.9 C)     Temp Source 12/13/20 1149 Oral     SpO2 12/13/20 1149 100 %     Weight 12/13/20 1141 110 lb (49.9 kg)     Height 12/13/20 1141 5\' 6"  (1.676 m)     Head Circumference --      Peak Flow --      Pain Score 12/13/20 1141 6     Pain Loc --      Pain Edu? --      Excl. in GC? --     Constitutional: Alert and oriented. Well appearing and in no acute distress. Eyes: Conjunctivae are normal. PERRL. EOMI. Head: Atraumatic. Nose: No congestion/rhinnorhea. Mouth/Throat: Mucous membranes are moist.  Oropharynx non-erythematous.  Edematous right tonsil before exudate. Neck: No stridor.  Hematological/Lymphatic/Immunilogical: No cervical lymphadenopathy. Cardiovascular: Normal rate, regular rhythm. Grossly normal heart sounds.  Good peripheral circulation. Respiratory: Normal respiratory effort.  No retractions. Lungs CTAB. Gastrointestinal: Soft and nontender. No distention. No abdominal bruits. No CVA tenderness. Neurologic:  Normal speech and language. No  gross focal neurologic deficits are appreciated. No gait instability. Skin:  Skin is warm, dry and intact. No rash noted. Psychiatric: Mood and affect are normal. Speech and behavior are normal.  ____________________________________________   LABS (all labs ordered are listed, but only abnormal results are displayed)  Labs Reviewed  GROUP A STREP BY PCR  MONONUCLEOSIS SCREEN    ____________________________________________  EKG   ____________________________________________  RADIOLOGY I, Joni Reining, personally viewed and evaluated these images (plain radiographs) as part of my medical decision making, as well as reviewing the written report by the radiologist.  ED MD interpretation:    Official radiology report(s): DG Neck Soft Tissue  Result Date: 12/13/2020 CLINICAL DATA:  Dysphagia EXAM: NECK SOFT TISSUES - 1+ VIEW COMPARISON:  None. FINDINGS: Frontal and lateral views were obtained. The epiglottis and aryepiglottic folds appear normal. There is localized subglottic narrowing of the trachea extending over a distance of approximately 1.2 cm. The remainder of the trachea appears normal. Prevertebral soft tissues are normal. No air-fluid level to suggest abscess. Tongue base region appears normal. No bony abnormality. Visualized upper lungs are clear. Note that there is upper thoracic levoscoliosis. IMPRESSION: Localized subglottic narrowing of the trachea. This appearance raises question of potential croup. Remainder of the trachea appears normal. The epiglottis and aryepiglottic folds appear normal. Other soft tissues appear unremarkable. Visualized upper lung regions are clear. Electronically Signed   By: Bretta Bang III M.D.   On: 12/13/2020 13:07    ____________________________________________   PROCEDURES  Procedure(s) performed (including Critical Care):  Procedures   ____________________________________________   INITIAL IMPRESSION / ASSESSMENT AND PLAN / ED COURSE  As part of my medical decision making, I reviewed the following data within the electronic MEDICAL RECORD NUMBER         Patient presents with recurrent tonsillitis.  Patient continued to have negative strep/ Monospot test.  Soft tissue neck shows localized subglottic narrowing of the trachea.  Patient given IM Solu-Medrol and a prescription for Medrol Dosepak.  Patient  also given viscous lidocaine and Bromfed-DM for swish and swallow.  Patient consulted to ENT clinic for definitive evaluation and treatment.      ____________________________________________   FINAL CLINICAL IMPRESSION(S) / ED DIAGNOSES  Final diagnoses:  Tonsillitis     ED Discharge Orders         Ordered    methylPREDNISolone (MEDROL DOSEPAK) 4 MG TBPK tablet        12/13/20 1407    lidocaine (XYLOCAINE) 2 % solution  Every 6 hours PRN        12/13/20 1407    brompheniramine-pseudoephedrine-DM 30-2-10 MG/5ML syrup  4 times daily PRN        12/13/20 1407          *Please note:  Rita Dunn was evaluated in Emergency Department on 12/13/2020 for the symptoms described in the history of present illness. She was evaluated in the context of the global COVID-19 pandemic, which necessitated consideration that the patient might be at risk for infection with the SARS-CoV-2 virus that causes COVID-19. Institutional protocols and algorithms that pertain to the evaluation of patients at risk for COVID-19 are in a state of rapid change based on information released by regulatory bodies including the CDC and federal and state organizations. These policies and algorithms were followed during the patient's care in the ED.  Some ED evaluations and interventions may be delayed as a result of limited staffing during and the pandemic.*   Note:  This  document was prepared using Conservation officer, historic buildings and may include unintentional dictation errors.    Joni Reining, PA-C 12/13/20 1412    Dionne Bucy, MD 12/13/20 [phone]

## 2020-12-13 NOTE — Discharge Instructions (Addendum)
Your recurrent tonsillitis is not caused by strep.  Advised further evaluation by ENT clinic for definitive treatment.  Call tomorrow morning tell them you are follow-up from the emergency room.  They will look for the consult and schedule appointment.  Take medication as directed pending evaluation by ENT.  Do not take Atarax and cough syrup at the same time.

## 2020-12-13 NOTE — ED Notes (Signed)
See triage note  Presents with sore throat and feels like throat is swelling  No fever

## 2020-12-13 NOTE — ED Triage Notes (Signed)
Pt comes pov with throat soreness/some swelling. States that she had tonsillitis a month ago and feels similar. Right side throat 3+ and left side 1+. Pain with swallowing. NAD.

## 2020-12-16 ENCOUNTER — Other Ambulatory Visit: Payer: Self-pay

## 2020-12-16 ENCOUNTER — Emergency Department
Admission: EM | Admit: 2020-12-16 | Discharge: 2020-12-16 | Disposition: A | Discharge status: Home / Self Care | Payer: BC Managed Care – PPO | Attending: Emergency Medicine | Admitting: Emergency Medicine

## 2020-12-16 DIAGNOSIS — J039 Acute tonsillitis, unspecified: Secondary | ICD-10-CM | POA: Diagnosis present

## 2020-12-16 DIAGNOSIS — J45909 Unspecified asthma, uncomplicated: Secondary | ICD-10-CM | POA: Insufficient documentation

## 2020-12-16 LAB — CBC WITH DIFFERENTIAL/PLATELET
Abs Immature Granulocytes: 0.06 10*3/uL (ref 0.00–0.07)
Basophils Absolute: 0 10*3/uL (ref 0.0–0.1)
Basophils Relative: 0 %
Eosinophils Absolute: 0 10*3/uL (ref 0.0–0.5)
Eosinophils Relative: 0 %
HCT: 37.2 % (ref 36.0–46.0)
Hemoglobin: 12.5 g/dL (ref 12.0–15.0)
Immature Granulocytes: 1 %
Lymphocytes Relative: 9 %
Lymphs Abs: 1.1 10*3/uL (ref 0.7–4.0)
MCH: 30 pg (ref 26.0–34.0)
MCHC: 33.6 g/dL (ref 30.0–36.0)
MCV: 89.4 fL (ref 80.0–100.0)
Monocytes Absolute: 1.2 10*3/uL — ABNORMAL HIGH (ref 0.1–1.0)
Monocytes Relative: 10 %
Neutro Abs: 9.7 10*3/uL — ABNORMAL HIGH (ref 1.7–7.7)
Neutrophils Relative %: 80 %
Platelets: 207 10*3/uL (ref 150–400)
RBC: 4.16 MIL/uL (ref 3.87–5.11)
RDW: 13.7 % (ref 11.5–15.5)
WBC: 12.1 10*3/uL — ABNORMAL HIGH (ref 4.0–10.5)
nRBC: 0 % (ref 0.0–0.2)

## 2020-12-16 LAB — MONONUCLEOSIS SCREEN: Mono Screen: NEGATIVE

## 2020-12-16 LAB — GROUP A STREP BY PCR: Group A Strep by PCR: NOT DETECTED

## 2020-12-16 MED ORDER — DOXYCYCLINE HYCLATE 100 MG PO CAPS: 100.0000 mg | ORAL_CAPSULE | Freq: Two times a day (BID) | ORAL | 0 refills | Status: DC

## 2020-12-16 MED ORDER — DOXYCYCLINE MONOHYDRATE 100 MG PO TABS: 100.0000 mg | ORAL_TABLET | Freq: Two times a day (BID) | ORAL | 0 refills | Status: DC

## 2020-12-16 NOTE — ED Triage Notes (Addendum)
Pt seen here a few days ago for sore throat with swelling. Pt has been on steroids but today states that she is having worsening swelling and SOB. Pt in NAD. Pt talking in full sentences. This RN saw pt last visit, and tonsils appear the to be improving from last visit.

## 2020-12-16 NOTE — ED Provider Notes (Signed)
[facility] Emergency Department Provider Note  ____________________________________________   Event Date/Time   First MD Initiated Contact with Patient 12/16/20 1553     (approximate)  I have reviewed the triage vital signs and the nursing notes.   HISTORY  Chief Complaint Shortness of Breath and Sore Throat    HPI Rita Dunn is a 20 y.o. female presents with continued tonsillitis.  Patient states she has had tonsillitis 2-3 times in the past few months.  States she was seen here and given steroids but feels that her throat is closing up.  Patient states the white spots showed up yesterday.  She is already seen the ENT who told her to have her tonsils taken out this summer.  Patient states she is just worried because it keeps coming back and she feels like she cannot breathe.    Past Medical History:  Diagnosis Date  . Asthma     There are no problems to display for this patient.   History reviewed. No pertinent surgical history.  Prior to Admission medications   Medication Sig Start Date End Date Taking? Authorizing Provider  doxycycline (ADOXA) 100 MG tablet Take 1 tablet (100 mg total) by mouth 2 (two) times daily. 12/16/20  Yes Fisher, Roselyn Bering, PA-C  methylPREDNISolone (MEDROL DOSEPAK) 4 MG TBPK tablet Take Tapered dose as directed starting tomorrow morning. 12/13/20   Joni Reining, PA-C    Allergies Penicillins  History reviewed. No pertinent family history.  Social History Social History   Tobacco Use  . Smoking status: Never Smoker  . Smokeless tobacco: Never Used  Substance Use Topics  . Alcohol use: Never  . Drug use: Never    Review of Systems  Constitutional: No fever/chills Eyes: No visual changes. ENT: Positive sore throat. Respiratory: Denies cough Cardiovascular: Denies chest pain Gastrointestinal: Denies abdominal pain Genitourinary: Negative for dysuria. Musculoskeletal: Negative for back pain. Skin: Negative  for rash. Psychiatric: no mood changes,     ____________________________________________   PHYSICAL EXAM:  VITAL SIGNS: ED Triage Vitals  Enc Vitals Group     BP 12/16/20 1534 108/69     Pulse Rate 12/16/20 1534 77     Resp 12/16/20 1534 18     Temp 12/16/20 1534 98.7 F (37.1 C)     Temp Source 12/16/20 1534 Oral     SpO2 12/16/20 1534 98 %     Weight 12/16/20 1534 110 lb 3.7 oz (50 kg)     Height 12/16/20 1534 5\' 6"  (1.676 m)     Head Circumference --      Peak Flow --      Pain Score 12/16/20 1533 0     Pain Loc --      Pain Edu? --      Excl. in GC? --     Constitutional: Alert and oriented. Well appearing and in no acute distress. Eyes: Conjunctivae are normal.  Head: Atraumatic. Nose: No congestion/rhinnorhea. Mouth/Throat: Mucous membranes are moist.  Tonsils are swollen with exudate noted Neck:  supple no lymphadenopathy noted Cardiovascular: Normal rate, regular rhythm. Heart sounds are normal Respiratory: Normal respiratory effort.  No retractions, lungs c t a  Abd: soft nontender bs normal all 4 quad GU: deferred Musculoskeletal: FROM all extremities, warm and well perfused Neurologic:  Normal speech and language.  Skin:  Skin is warm, dry and intact. No rash noted. Psychiatric: Mood and affect are normal. Speech and behavior are normal.  ____________________________________________   LABS (all  labs ordered are listed, but only abnormal results are displayed)  Labs Reviewed  CBC WITH DIFFERENTIAL/PLATELET - Abnormal; Notable for the following components:      Result Value   WBC 12.1 (*)    Neutro Abs 9.7 (*)    Monocytes Absolute 1.2 (*)    All other components within normal limits  GROUP A STREP BY PCR  MONONUCLEOSIS SCREEN  EPSTEIN-BARR VIRUS NUCLEAR ANTIGEN ANTIBODY, IGG  EPSTEIN-BARR VIRUS VCA, IGM    ____________________________________________   ____________________________________________  RADIOLOGY    ____________________________________________   PROCEDURES  Procedure(s) performed: No  Procedures    ____________________________________________   INITIAL IMPRESSION / ASSESSMENT AND PLAN / ED COURSE  Pertinent labs & imaging results that were available during my care of the patient were reviewed by me and considered in my medical decision making (see chart for details).   Patient is 20 year old female presents with recurrent tonsillitis.  See HPI.  Physical exam shows patient appears stable  In review of her past medical charts, she is already had CT soft tissue of the neck, gonorrhea and Chlamydia testing along with strep and mono which were all negative.  At this time we will repeat strep and mono, I will do a Epstein-Barr panel for further evaluation by ENT.   strep and mono are negative  Explained everything to the patient.  Rx for doxy 100 bid , pt is to finish her steroid She is to f/u with ENT, return to ER if worsening  Rita Dunn was evaluated in Emergency Department on 12/16/2020 for the symptoms described in the history of present illness. She was evaluated in the context of the global COVID-19 pandemic, which necessitated consideration that the patient might be at risk for infection with the SARS-CoV-2 virus that causes COVID-19. Institutional protocols and algorithms that pertain to the evaluation of patients at risk for COVID-19 are in a state of rapid change based on information released by regulatory bodies including the CDC and federal and state organizations. These policies and algorithms were followed during the patient's care in the ED.    As part of my medical decision making, I reviewed the following data within the electronic MEDICAL RECORD NUMBER Nursing notes reviewed and incorporated, Labs reviewed , Old chart reviewed, Notes from prior ED visits  and [place] Controlled Substance Database  ____________________________________________   FINAL CLINICAL IMPRESSION(S) / ED DIAGNOSES  Final diagnoses:  Acute tonsillitis, unspecified etiology      NEW MEDICATIONS STARTED DURING THIS VISIT:  New Prescriptions   DOXYCYCLINE (ADOXA) 100 MG TABLET    Take 1 tablet (100 mg total) by mouth 2 (two) times daily.     Note:  This document was prepared using Dragon voice recognition software and may include unintentional dictation errors.    Faythe Ghee, PA-C 12/16/20 1718    Concha Se, MD 12/17/20 [phone]

## 2020-12-16 NOTE — Discharge Instructions (Addendum)
Follow-up with ENT.  Please call for appointment. Finish your steroid pack, finish the antibiotic as prescribed.  Gargle with warm salt water as this is a natural antiseptic.

## 2020-12-16 NOTE — ED Notes (Signed)
Patient is alert and oriented x4, ambulated to room, and in no acute distress. Will continue to monitor and assess.

## 2020-12-20 LAB — EPSTEIN-BARR VIRUS NUCLEAR ANTIGEN ANTIBODY, IGG: EBV NA IgG: <18 U/mL (ref 0.0–17.9)

## 2020-12-20 LAB — EPSTEIN-BARR VIRUS VCA, IGM: EBV VCA IgM: 151 U/mL — ABNORMAL HIGH (ref 0.0–35.9)

## 2021-05-20 ENCOUNTER — Emergency Department
Admission: EM | Admit: 2021-05-20 | Discharge: 2021-05-21 | Disposition: A | Discharge status: Home / Self Care | Payer: BC Managed Care – PPO | Attending: Emergency Medicine | Admitting: Emergency Medicine

## 2021-05-20 DIAGNOSIS — J45909 Unspecified asthma, uncomplicated: Secondary | ICD-10-CM | POA: Insufficient documentation

## 2021-05-20 DIAGNOSIS — Z20822 Contact with and (suspected) exposure to covid-19: Secondary | ICD-10-CM | POA: Insufficient documentation

## 2021-05-20 DIAGNOSIS — Z87891 Personal history of nicotine dependence: Secondary | ICD-10-CM | POA: Diagnosis not present

## 2021-05-20 DIAGNOSIS — H538 Other visual disturbances: Secondary | ICD-10-CM | POA: Insufficient documentation

## 2021-05-20 DIAGNOSIS — R519 Headache, unspecified: Secondary | ICD-10-CM | POA: Diagnosis present

## 2021-05-20 HISTORY — DX: Post-traumatic stress disorder, unspecified: F43.10

## 2021-05-20 HISTORY — DX: Attention-deficit hyperactivity disorder, unspecified type: F90.9

## 2021-05-20 HISTORY — DX: Anxiety disorder, unspecified: F41.9

## 2021-05-20 LAB — CBC
HCT: 39.3 % (ref 36.0–46.0)
Hemoglobin: 13.6 g/dL (ref 12.0–15.0)
MCH: 30.6 pg (ref 26.0–34.0)
MCHC: 34.6 g/dL (ref 30.0–36.0)
MCV: 88.5 fL (ref 80.0–100.0)
Platelets: 236 10*3/uL (ref 150–400)
RBC: 4.44 MIL/uL (ref 3.87–5.11)
RDW: 12.4 % (ref 11.5–15.5)
WBC: 7.7 10*3/uL (ref 4.0–10.5)
nRBC: 0 % (ref 0.0–0.2)

## 2021-05-20 LAB — COMPREHENSIVE METABOLIC PANEL
ALT: 15 U/L (ref 0–44)
AST: 20 U/L (ref 15–41)
Albumin: 4.7 g/dL (ref 3.5–5.0)
Alkaline Phosphatase: 67 U/L (ref 38–126)
Anion gap: 7 (ref 5–15)
BUN: 11 mg/dL (ref 6–20)
CO2: 24 mmol/L (ref 22–32)
Calcium: 9.8 mg/dL (ref 8.9–10.3)
Chloride: 106 mmol/L (ref 98–111)
Creatinine, Ser: 0.65 mg/dL (ref 0.44–1.00)
GFR, Estimated: >60 mL/min (ref >60)
Glucose, Bld: 129 mg/dL — ABNORMAL HIGH (ref 70–99)
Potassium: 3.4 mmol/L — ABNORMAL LOW (ref 3.5–5.1)
Sodium: 137 mmol/L (ref 135–145)
Total Bilirubin: 0.6 mg/dL (ref 0.3–1.2)
Total Protein: 7.8 g/dL (ref 6.5–8.1)

## 2021-05-20 NOTE — ED Triage Notes (Addendum)
Pt reports feeling "spacey," headache, intermittent generalized tingling x 4 days.  Pain score  8/10.  Pt reports taking Tylenol w/o relief.  Hx of anxiety, PTSD, and ADHD.  Pt was started on zoloft x 3 weeks ago.    Pt recently started sophomore year of college. Pt restarted her birthcontrol pill x 3 days ago.

## 2021-05-20 NOTE — ED Provider Notes (Signed)
[facility] Emergency Department Provider Note   ____________________________________________   Event Date/Time   First MD Initiated Contact with Patient 05/20/21 2355     (approximate)  I have reviewed the triage vital signs and the nursing notes.   HISTORY  Chief Complaint Headache    HPI Rita Dunn is a 20 y.o. female who [facility] with a chief complaint of headache.  Patient with a history of asthma, anxiety, ADHD, PTSD who reports onset of headache 3 days ago.  Reports migrating, constant, dull headache associated with occasional blurry vision.  Now having pain from the nape of her neck into the back of her head.  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  Symptoms unrelieved with OTC medications.  Also reports feeling "spacey" with dizziness/lightheadedness.  Intermittent tingling and sensation of weakness in her BLE.  Denies slurred speech, altered mentation, facial droop, unilateral extremity weakness.  Denies known tick bite this summer.  Had sinusitis back in June; symptoms resolved after doxycycline.  She was started on Zoloft 3 weeks ago taking one quarter of a 35 mg tablet.  She had titrated up to one half of a tablet but missed several doses so her doctor told her to stop it.  She is supposed to restart her ADHD medication but has not yet started.  Reports sinus congestion and ringing in her ear since returning to [place].  She also restarted her birth control pill 3 days ago.  Denies fever, cough, chest pain, shortness of breath, abdominal pain, nausea, vomiting or dysuria.  Denies recent trauma or head injury.  No known lead exposure.     Past Medical History:  Diagnosis Date   ADHD    Anxiety    Asthma    PTSD (post-traumatic stress disorder)     There are no problems to display for this patient.   Past Surgical History:  Procedure Laterality Date   WISDOM TOOTH EXTRACTION       Prior to Admission medications   Medication Sig Start Date End Date Taking? Authorizing Provider  butalbital-acetaminophen-caffeine (FIORICET) 50-325-40 MG tablet Take 1 tablet by mouth every 6 (six) hours as needed for headache. 05/21/21  Yes Paulette Blanch, MD  doxycycline (ADOXA) 100 MG tablet Take 1 tablet (100 mg total) by mouth 2 (two) times daily. 12/16/20   Caryn Section Linden Dolin, PA-C  methylPREDNISolone (MEDROL DOSEPAK) 4 MG TBPK tablet Take Tapered dose as directed starting tomorrow morning. 12/13/20   Sable Feil, PA-C    Allergies Penicillins  Family history None for MS or neurodegenerative disease  Social History Social History   Tobacco Use   Smoking status: Former    Types: Cigarettes   Smokeless tobacco: Never  Substance Use Topics   Alcohol use: Yes   Drug use: Never    Review of Systems  Constitutional: No fever/chills Eyes: No visual changes. ENT: No sore throat. Cardiovascular: Denies chest pain. Respiratory: Denies shortness of breath. Gastrointestinal: No abdominal pain.  No nausea, no vomiting.  No diarrhea.  No constipation. Genitourinary: Negative for dysuria. Musculoskeletal: Negative for back pain. Skin: Negative for rash. Neurological: Positive for headache and generalized tingling.  Negative for focal weakness or numbness.   ____________________________________________   PHYSICAL EXAM:  VITAL SIGNS: ED Triage Vitals  Enc Vitals Group     BP 05/20/21 1733 127/78     Pulse Rate 05/20/21 1733 77     Resp 05/20/21  1733 16     Temp 05/20/21 1733 98.6 F (37 C)     Temp Source 05/20/21 1733 Oral     SpO2 05/20/21 1733 99 %     Weight 05/20/21 1734 125 lb (56.7 kg)     Height 05/20/21 1734 5' 5"  (1.651 m)     Head Circumference --      Peak Flow --      Pain Score 05/20/21 1734 8     Pain Loc --      Pain Edu? --      Excl. in [place]? --     Constitutional: Alert and oriented. Well appearing and in no acute distress. Eyes: Conjunctivae  are normal. PERRL. EOMI. Head: Atraumatic. Ears: Bilateral TM unremarkable. Nose: No congestion/rhinnorhea. Mouth/Throat: Mucous membranes are moist.   Neck: No stridor.  Supple neck without meningismus.  No carotid bruits. Cardiovascular: Normal rate, regular rhythm. Grossly normal heart sounds.  Good peripheral circulation. Respiratory: Normal respiratory effort.  No retractions. Lungs CTAB. Gastrointestinal: Soft and nontender. No distention. No abdominal bruits. No CVA tenderness. Musculoskeletal: No lower extremity tenderness nor edema.  No joint effusions. Neurologic: Alert and oriented x3.  CN II to XII grossly intact.  Normal speech and language.  5/5 motor strength and sensation all extremities.  No gross focal neurologic deficits are appreciated. MAEx4. No gait instability. Skin:  Skin is warm, dry and intact. No rash noted.  No petechiae. Psychiatric: Mood and affect are normal. Speech and behavior are normal.  ____________________________________________   LABS (all labs ordered are listed, but only abnormal results are displayed)  Labs Reviewed  COMPREHENSIVE METABOLIC PANEL - Abnormal; Notable for the following components:      Result Value   Potassium 3.4 (*)    Glucose, Bld 129 (*)    All other components within normal limits  RESP PANEL BY RT-PCR (FLU A&B, COVID) ARPGX2  CBC   ____________________________________________  EKG  None ____________________________________________  RADIOLOGY I, Shane Badeaux J, personally viewed and evaluated these images (plain radiographs) as part of my medical decision making, as well as reviewing the written report by the radiologist.  ED MD interpretation: Unremarkable MRIs of brain, and lumbar spine; cervical spine with reversal of normal lordosis, otherwise normal  Official radiology report(s): MR BRAIN WO CONTRAST  Result Date: 05/21/2021 CLINICAL DATA:  Dizziness and headache.  History of optic tumor. EXAM: MRI HEAD WITHOUT  CONTRAST TECHNIQUE: Multiplanar, multiecho pulse sequences of the brain and surrounding structures were obtained without intravenous contrast. COMPARISON:  None. FINDINGS: Brain: No acute infarct, mass effect or extra-axial collection. No acute or chronic hemorrhage. Normal white matter signal, parenchymal volume and CSF spaces. The midline structures are normal. Vascular: Major flow voids are preserved. Skull and upper cervical spine: Normal calvarium and skull base. Visualized upper cervical spine and soft tissues are normal. Sinuses/Orbits:No paranasal sinus fluid levels or advanced mucosal thickening. No mastoid or middle ear effusion. Normal orbits. IMPRESSION: Normal brain MRI. Electronically Signed   By: Ulyses Jarred M.D.   On: 05/21/2021 02:20   MR Cervical Spine Wo Contrast  Result Date: 05/21/2021 CLINICAL DATA:  Headache and tingling. EXAM: MRI CERVICAL SPINE WITHOUT CONTRAST TECHNIQUE: Multiplanar, multisequence MR imaging of the cervical spine was performed. No intravenous contrast was administered. COMPARISON:  None. FINDINGS: Alignment: Reversal of normal cervical lordosis may be positional or due to muscle spasm. Vertebrae: No fracture, evidence of discitis, or bone lesion. Cord: Normal signal and morphology. Posterior Fossa, vertebral arteries, paraspinal tissues:  Negative. Disc levels: No spinal canal or neural foraminal stenosis.  No disc herniation. IMPRESSION: Reversal of normal cervical lordosis may be positional or due to muscle spasm. Otherwise normal cervical spine MRI. Electronically Signed   By: Ulyses Jarred M.D.   On: 05/21/2021 02:32   MR LUMBAR SPINE WO CONTRAST  Result Date: 05/21/2021 CLINICAL DATA:  Bilateral lower extremity tingling. EXAM: MRI LUMBAR SPINE WITHOUT CONTRAST TECHNIQUE: Multiplanar, multisequence MR imaging of the lumbar spine was performed. No intravenous contrast was administered. COMPARISON:  None. FINDINGS: Segmentation:  Standard. Alignment:   Physiologic. Vertebrae:  No fracture, evidence of discitis, or bone lesion. Conus medullaris and cauda equina: Conus extends to the L2 level. Conus and cauda equina appear normal. Paraspinal and other soft tissues: Negative Disc levels: No spinal canal or neural foraminal stenosis.  No disc herniation. IMPRESSION: Normal lumbar spine MRI. Electronically Signed   By: Ulyses Jarred M.D.   On: 05/21/2021 02:21    ____________________________________________   PROCEDURES  Procedure(s) performed (including Critical Care):  Procedures   ____________________________________________   INITIAL IMPRESSION / ASSESSMENT AND PLAN / ED COURSE  As part of my medical decision making, I reviewed the following data within the [place] notes reviewed and incorporated, Labs reviewed, Old chart reviewed, Radiograph reviewed, and Notes from prior ED visits     20 year old female presenting with headache and myriad of accompanying symptoms. Differential diagnosis includes, but is not limited to, intracranial hemorrhage, meningitis/encephalitis, previous head trauma, cavernous venous thrombosis, tension headache, temporal arteritis, migraine or migraine equivalent, idiopathic intracranial hypertension, and non-specific headache.   Basic lab work unremarkable.  Will check COVID swab, obtain MRI brain/cervical spine/lumbar spine given nonfocal neurological symptoms.  Administer IV Toradol, fluids.  Will reassess.  Clinical Course as of 05/21/21 0530  Mon May 21, 2021  0247 Patient resting in no acute distress.  No focal neurological deficits noted on reexamination.  Updated her on MRI results.  Will prescribe Fioricet to use as needed and provide outpatient neurology follow-up.  Strict return precautions given.  Patient verbalizes understanding and agrees with plan of care [JS]    Clinical Course User Index [JS] Paulette Blanch, MD      ____________________________________________   FINAL CLINICAL IMPRESSION(S) / ED DIAGNOSES  Final diagnoses:  Acute nonintractable headache, unspecified headache type     ED Discharge Orders          Ordered    butalbital-acetaminophen-caffeine (FIORICET) 50-325-40 MG tablet  Every 6 hours PRN        05/21/21 0251             Note:  This document was prepared using Dragon voice recognition software and may include unintentional dictation errors.    Paulette Blanch, MD 05/21/21 [phone]

## 2021-05-20 NOTE — ED Notes (Signed)
Pt updated on wait time, warm blanket given

## 2021-05-21 ENCOUNTER — Emergency Department: Payer: BC Managed Care – PPO

## 2021-05-21 LAB — RESP PANEL BY RT-PCR (FLU A&B, COVID) ARPGX2
Influenza A by PCR: NEGATIVE
Influenza B by PCR: NEGATIVE
SARS Coronavirus 2 by RT PCR: NEGATIVE

## 2021-05-21 IMAGING — MR MR CERVICAL SPINE W/O CM
5 series · 41 of 48 positions shown · non-contrast
Comparison: None.

CLINICAL DATA: Headache and tingling.

EXAM:
MRI CERVICAL SPINE WITHOUT CONTRAST
TECHNIQUE: Multiplanar, multisequence MR imaging of the cervical spine was
performed. No intravenous contrast was administered.

[Series 9: T2 · sagittal · 3.0mm · 0.50mm/px · 7 of 15 slices shown (1 of 2)]
[im 1/15]
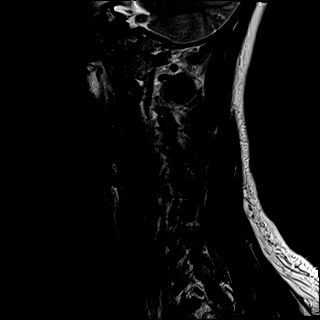
[im 3/15]
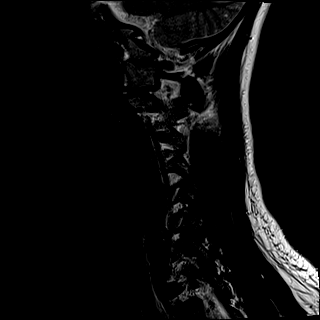
[im 5/15]
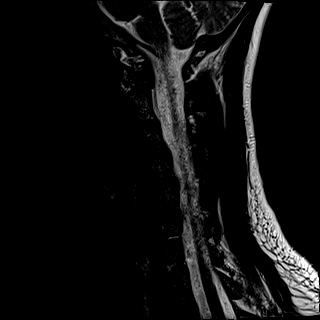
[im 8/15]
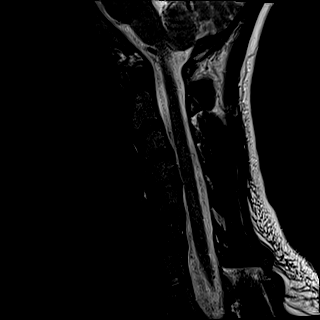
[im 10/15]
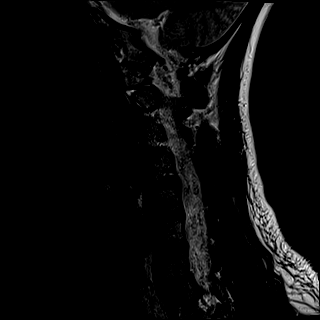
[im 12/15]
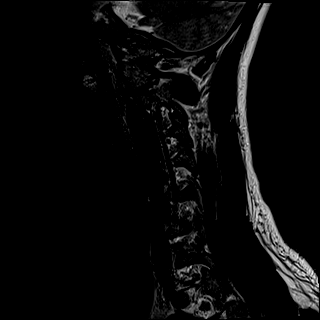
[im 15/15]
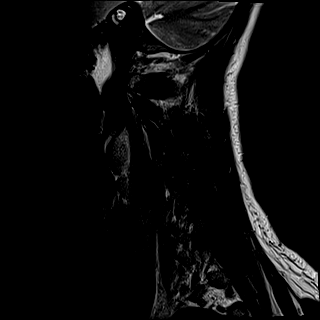

[Series 10: FLAIR · sagittal · 3.0mm · 0.78mm/px · 7 of 15 slices shown]
[im 1/15]
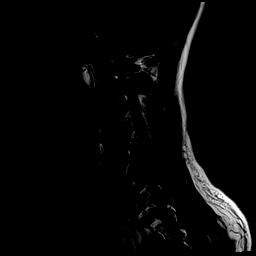
[im 3/15]
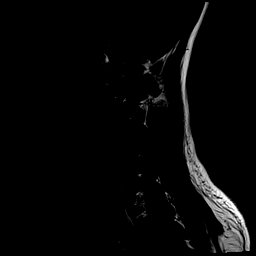
[im 5/15]
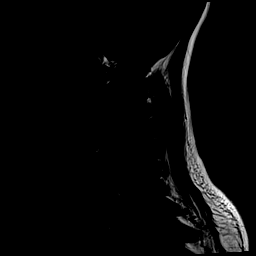
[im 8/15]
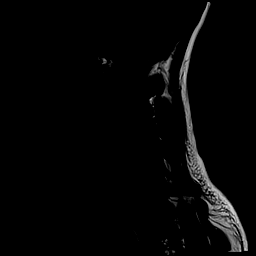
[im 10/15]
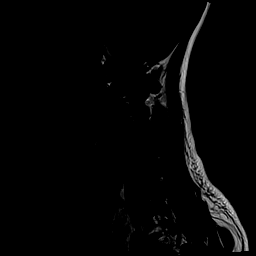
[im 12/15]
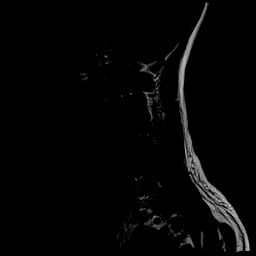
[im 15/15]
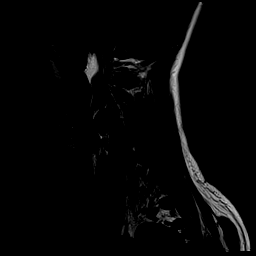

[Series 11: STIR · sagittal · 3.0mm · 0.50mm/px · 7 of 15 slices shown]
[im 1/15]
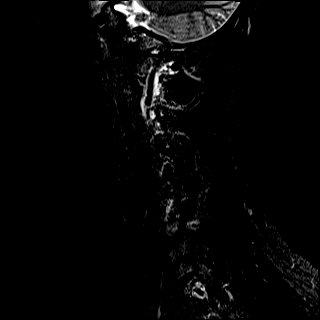
[im 3/15]
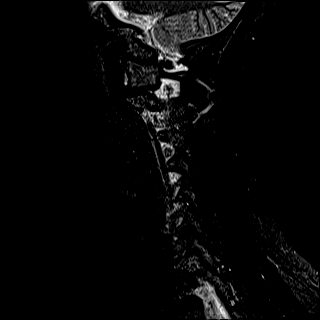
[im 5/15]
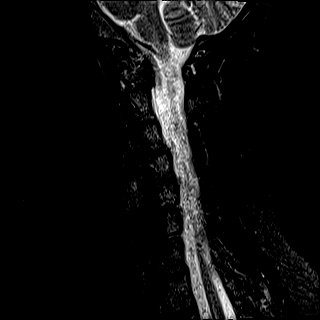
[im 8/15]
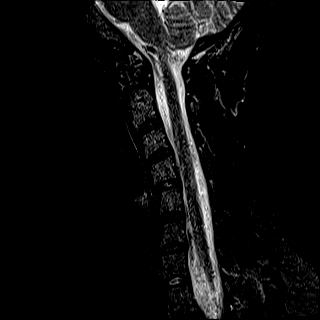
[im 10/15]
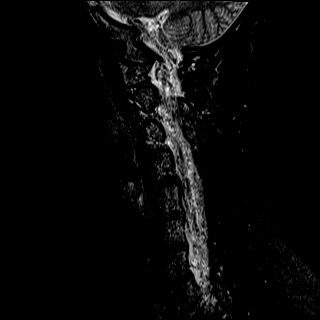
[im 12/15]
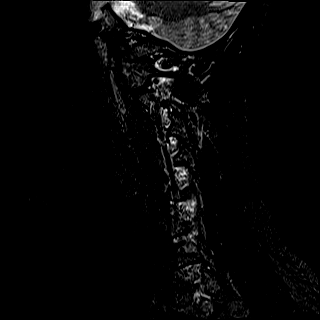
[im 15/15]
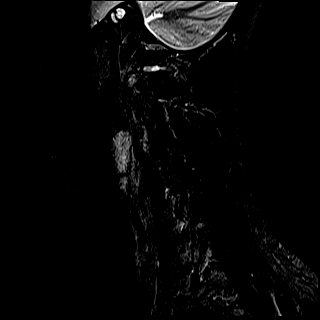

[Series 13: ax mpgr · axial · 3.0mm · 0.35mm/px · z∈[-225,-131]mm · 8 of 28 slices shown]
[im 1/28]
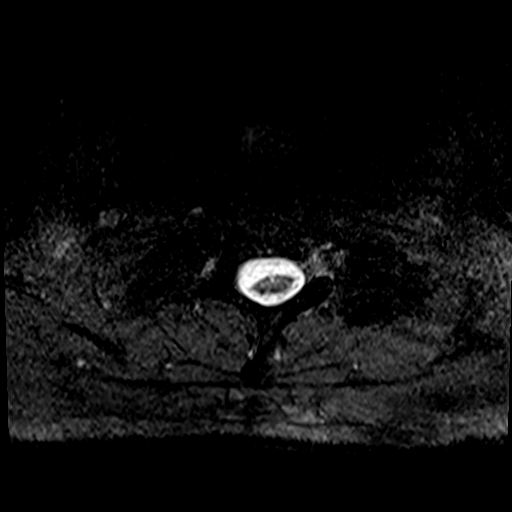
[im 5/28]
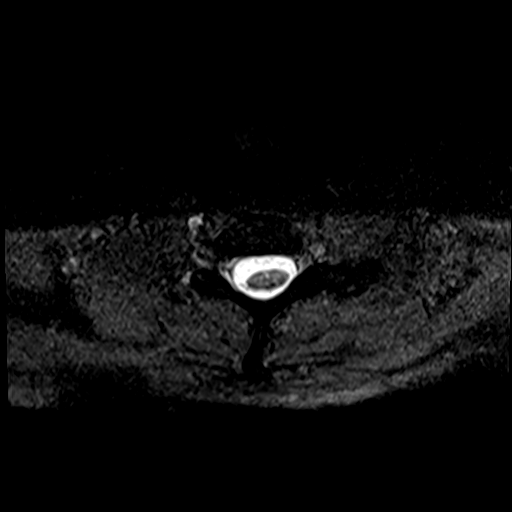
[im 10/28]
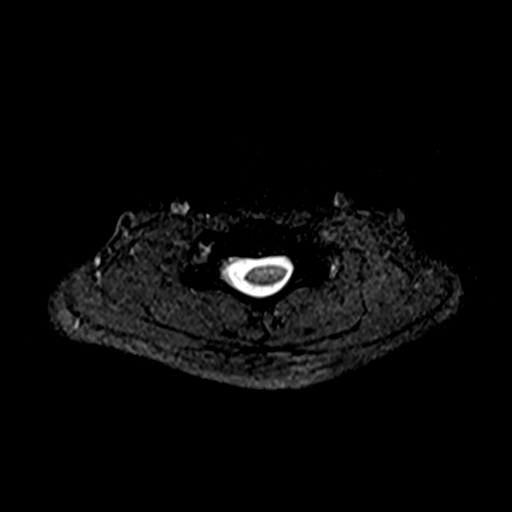
[im 12/28]
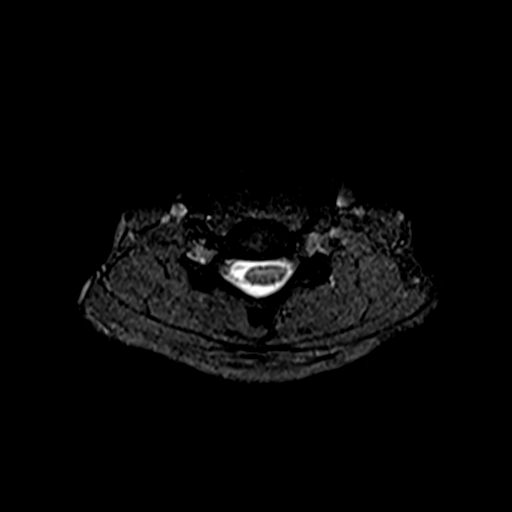
[im 16/28]
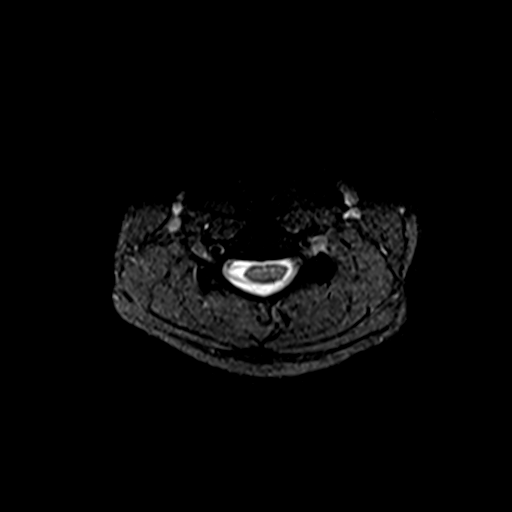
[im 19/28]
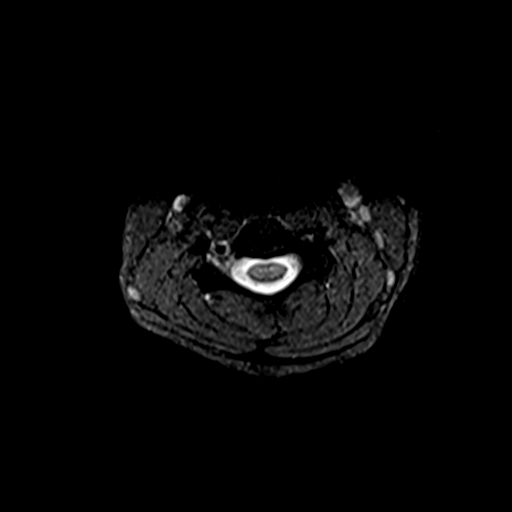
[im 23/28]
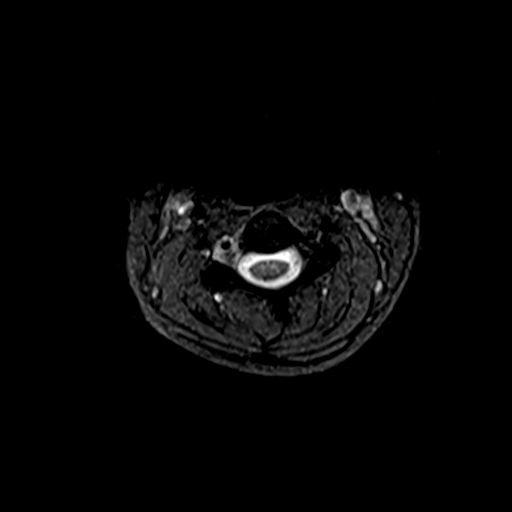
[im 28/28]
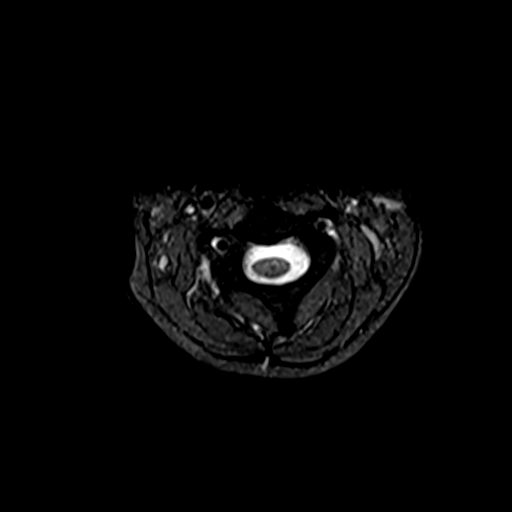

[Series 14: T2 · axial · 3.0mm · 0.70mm/px · z∈[-229,-128]mm · 12 of 30 slices shown (2 of 2)]
[im 1/30]
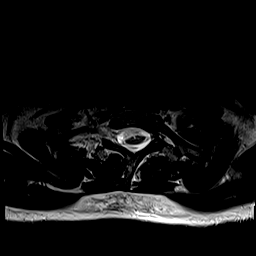
[im 3/30]
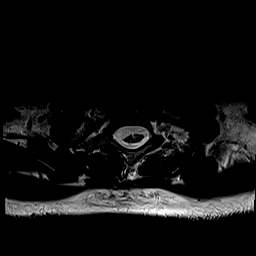
[im 5/30]
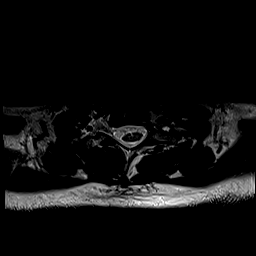
[im 7/30]
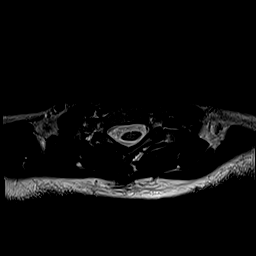
[im 9/30]
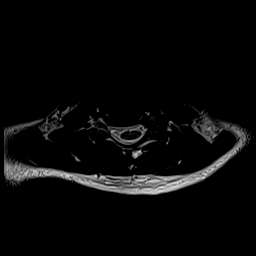
[im 12/30]
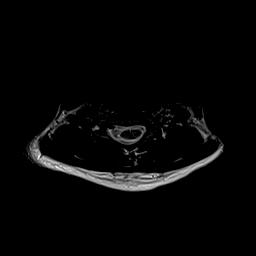
[im 14/30]
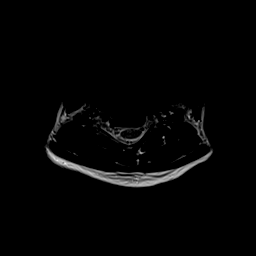
[im 16/30]
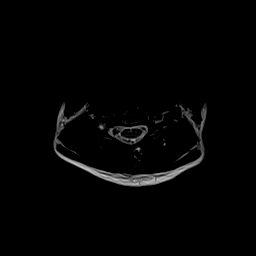
[im 18/30]
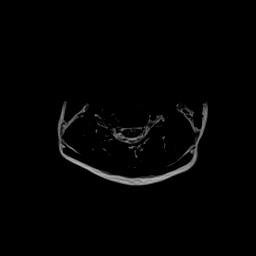
[im 21/30]
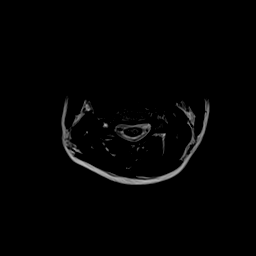
[im 25/30]
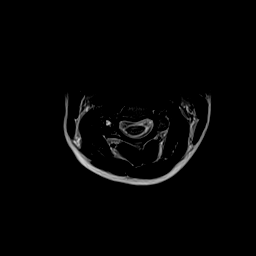
[im 30/30]
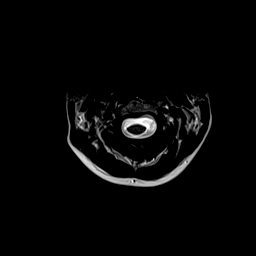

[41 of 48 positions shown; findings below may reference images not displayed]

FINDINGS: Alignment: Reversal of normal cervical lordosis may be positional or
due to muscle spasm.

Vertebrae: No fracture, evidence of discitis, or bone lesion.

Cord: Normal signal and morphology.

Posterior Fossa, vertebral arteries, paraspinal tissues: Negative.

Disc levels:

No spinal canal or neural foraminal stenosis.  No disc herniation.
IMPRESSION: Reversal of normal cervical lordosis may be positional or due to
muscle spasm. Otherwise normal cervical spine MRI.

## 2021-05-21 IMAGING — MR MR HEAD W/O CM
12 series · 48 of 48 positions shown · non-contrast
Comparison: None.

CLINICAL DATA: Dizziness and headache.  History of optic tumor.

EXAM:
MRI HEAD WITHOUT CONTRAST
TECHNIQUE: Multiplanar, multiecho pulse sequences of the brain and surrounding
structures were obtained without intravenous contrast.

[Series 5: ax dwi_tracew · axial · 3.0mm · 0.65mm/px · z∈[-121,+18]mm · 3 of 44 slices shown]
[im 1/44]
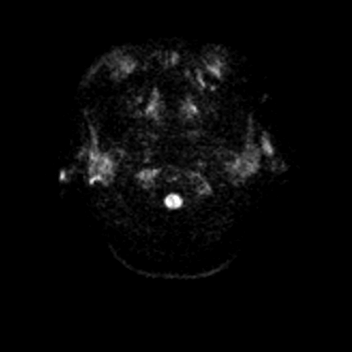
[im 22/44]
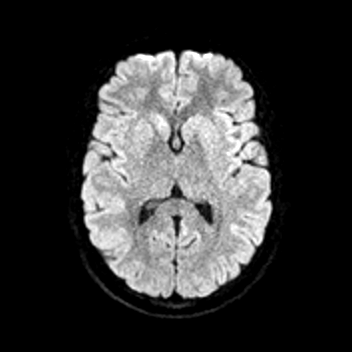
[im 44/44]
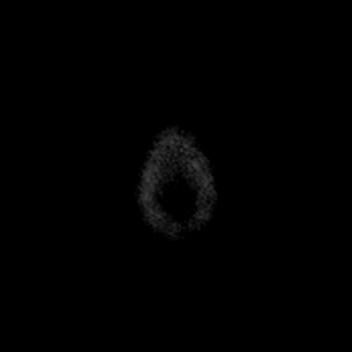

[Series 6: ax dwi_adc · axial · 3.0mm · 0.65mm/px · z∈[-121,+18]mm · 3 of 44 slices shown]
[im 1/44]
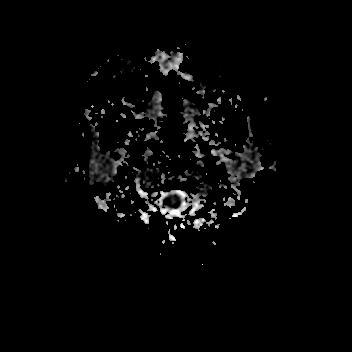
[im 22/44]
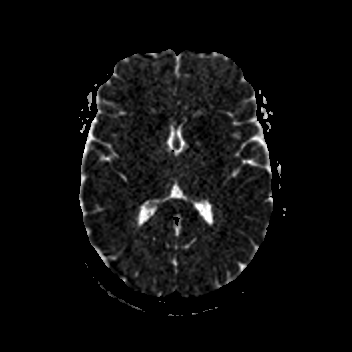
[im 44/44]
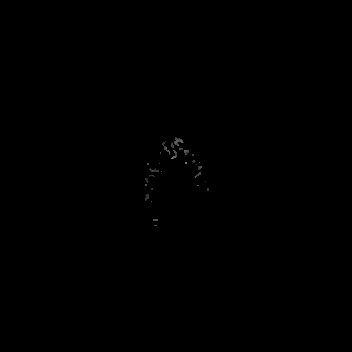

[Series 7: cor dwi_tracew · coronal · 5.0mm · 0.60mm/px · 3 of 36 slices shown]
[im 1/36]
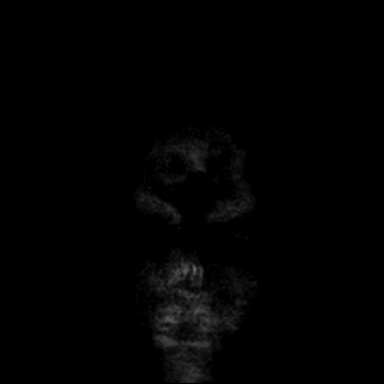
[im 18/36]
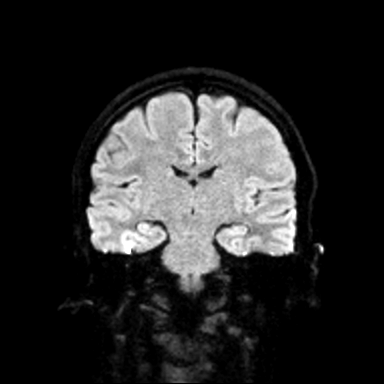
[im 36/36]
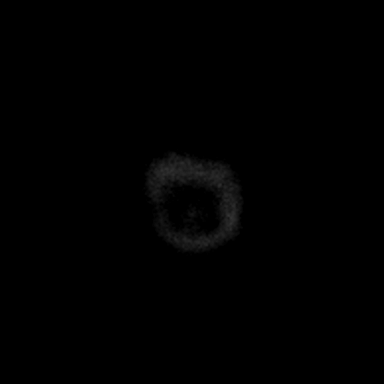

[Series 8: cor dwi_adc · coronal · 5.0mm · 0.60mm/px · 3 of 36 slices shown]
[im 1/36]
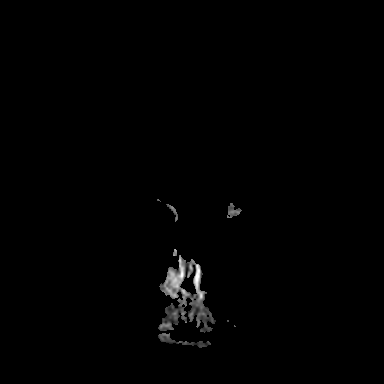
[im 18/36]
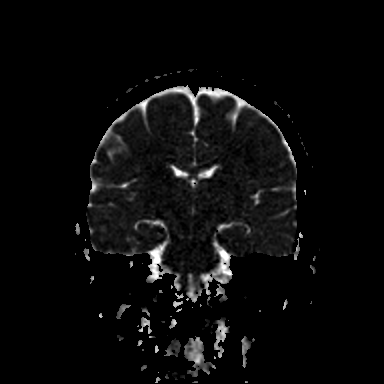
[im 36/36]
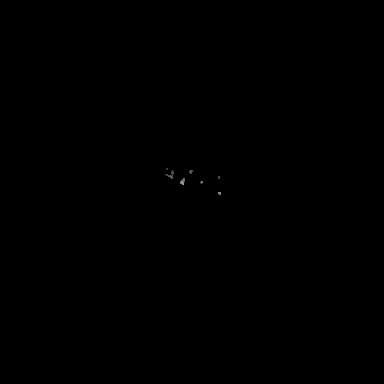

[Series 9: T1 · sagittal · 5.0mm · 0.62mm/px · 2 of 20 slices shown (1 of 2)]
[im 1/20]
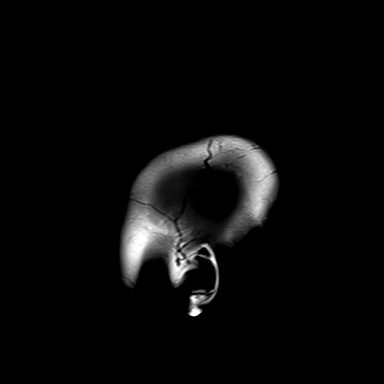
[im 20/20]
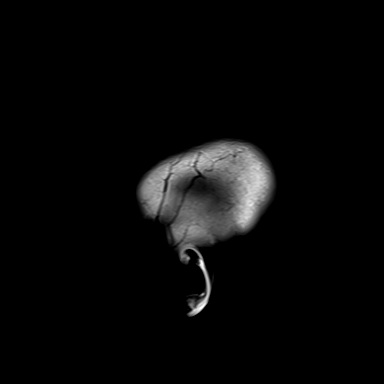

[Series 10: T2 · axial · 5.0mm · 0.57mm/px · z∈[-119,+18]mm · 2 of 24 slices shown (1 of 2)]
[im 1/24]
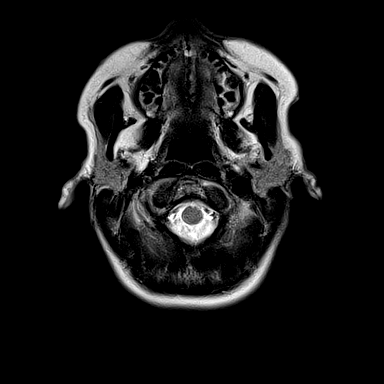
[im 24/24]
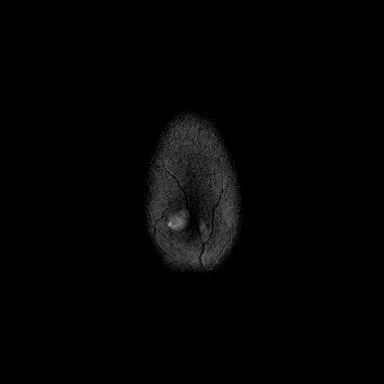

[Series 11: mag_images · axial · 3.0mm · 0.90mm/px · z∈[-138,+36]mm · 5 of 60 slices shown]
[im 1/60]
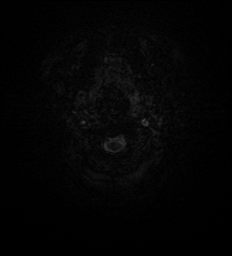
[im 15/60]
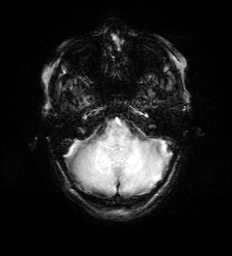
[im 30/60]
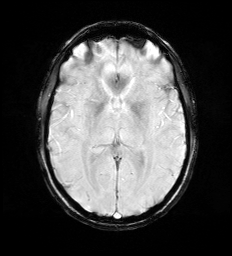
[im 45/60]
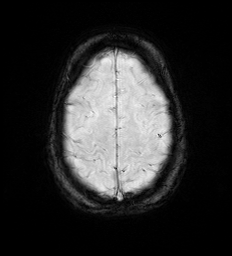
[im 60/60]
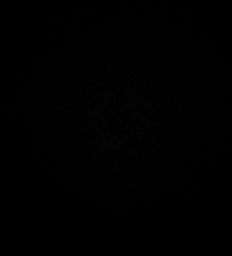

[Series 12: pha_images · axial · 3.0mm · 0.90mm/px · z∈[-138,+30]mm · 4 of 58 slices shown]
[im 1/58]
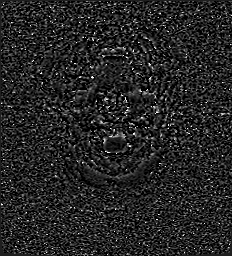
[im 20/58]
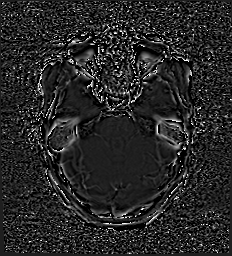
[im 39/58]
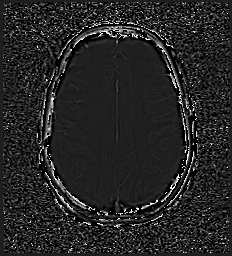
[im 58/58]
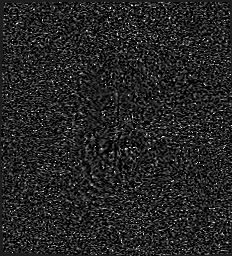

[Series 13: swi_images · axial · 3.0mm · 0.90mm/px · z∈[-138,+36]mm · 5 of 60 slices shown]
[im 1/60]
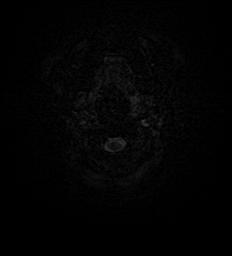
[im 15/60]
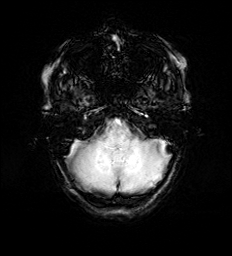
[im 30/60]
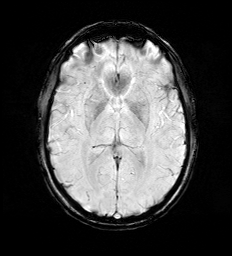
[im 45/60]
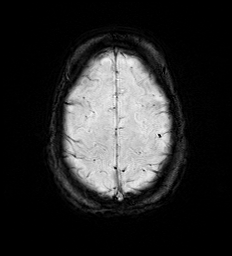
[im 60/60]
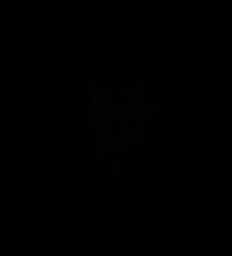

[Series 15: FLAIR · axial · 3.0mm · 0.53mm/px · z∈[-130,+29]mm · 4 of 55 slices shown]
[im 1/55]
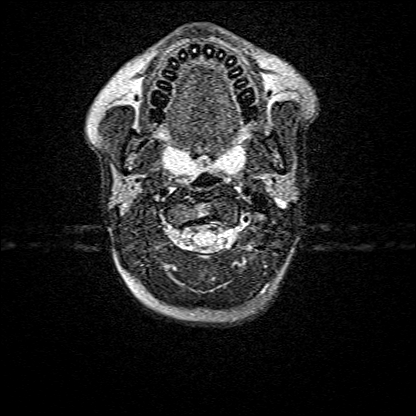
[im 19/55]
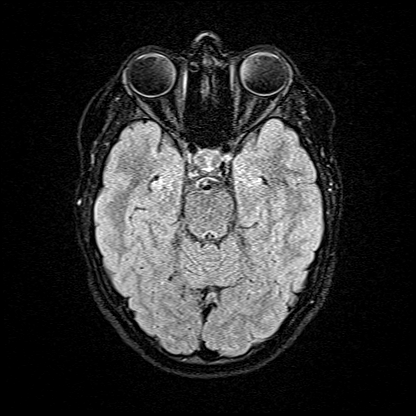
[im 37/55]
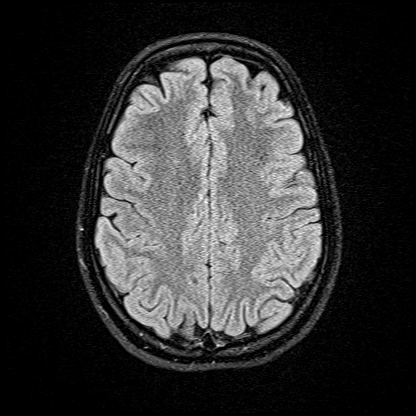
[im 55/55]
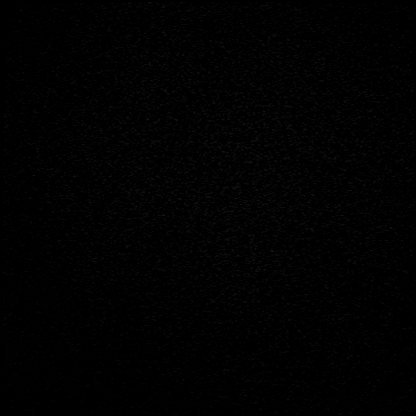

[Series 16: T1 · axial · 1.0mm · 0.98mm/px · z∈[-131,+25]mm · 12 of 160 slices shown (2 of 2)]
[im 1/160]
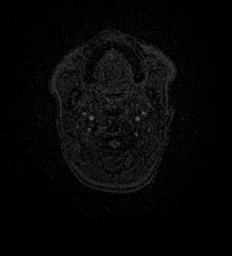
[im 15/160]
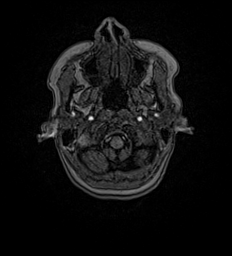
[im 29/160]
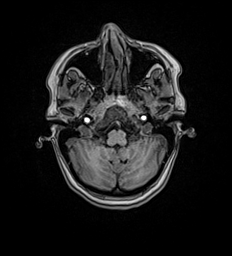
[im 44/160]
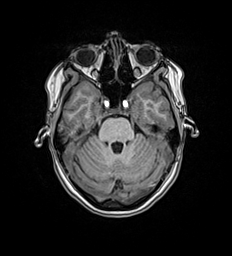
[im 58/160]
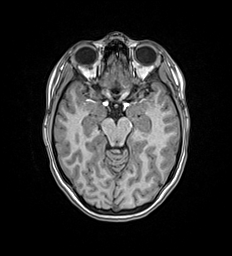
[im 73/160]
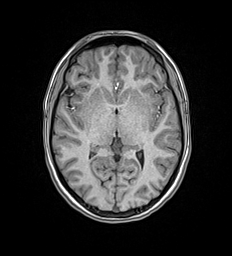
[im 87/160]
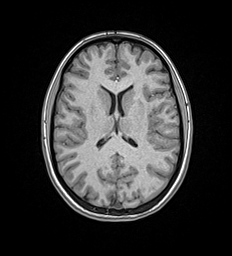
[im 102/160]
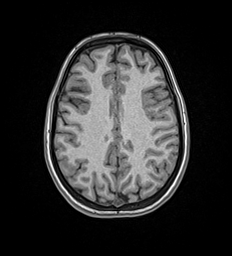
[im 116/160]
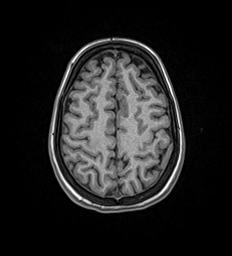
[im 131/160]
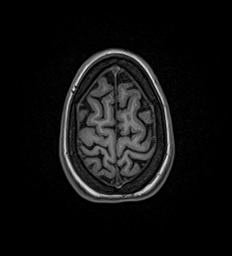
[im 145/160]
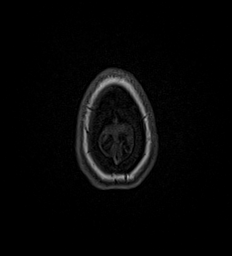
[im 160/160]
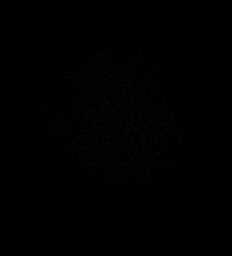

[Series 17: T2 · coronal · 5.0mm · 0.65mm/px · 2 of 28 slices shown (2 of 2)]
[im 1/28]
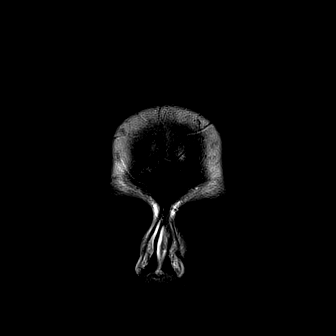
[im 28/28]
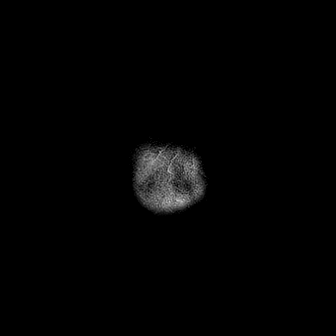

[48 of 48 positions shown; findings below may reference images not displayed]

FINDINGS: Brain: No acute infarct, mass effect or extra-axial collection. No
acute or chronic hemorrhage. Normal white matter signal, parenchymal
volume and CSF spaces. The midline structures are normal.

Vascular: Major flow voids are preserved.

Skull and upper cervical spine: Normal calvarium and skull base.
Visualized upper cervical spine and soft tissues are normal.

Sinuses/Orbits:No paranasal sinus fluid levels or advanced mucosal
thickening. No mastoid or middle ear effusion. Normal orbits.
IMPRESSION: Normal brain MRI.

## 2021-05-21 IMAGING — MR MR LUMBAR SPINE W/O CM
5 series · 32 of 48 positions shown · non-contrast
Comparison: None.

CLINICAL DATA: Bilateral lower extremity tingling.

EXAM:
MRI LUMBAR SPINE WITHOUT CONTRAST
TECHNIQUE: Multiplanar, multisequence MR imaging of the lumbar spine was
performed. No intravenous contrast was administered.

[Series 5: T2 · sagittal · 4.0mm · 0.81mm/px · 6 of 14 slices shown (1 of 2)]
[im 1/14]
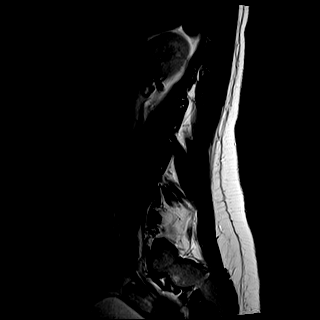
[im 3/14]
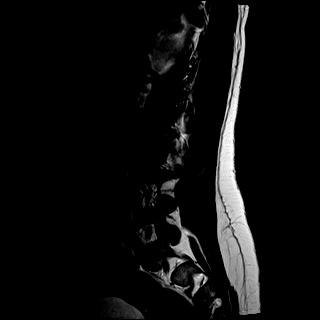
[im 6/14]
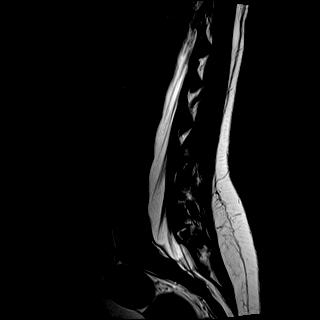
[im 8/14]
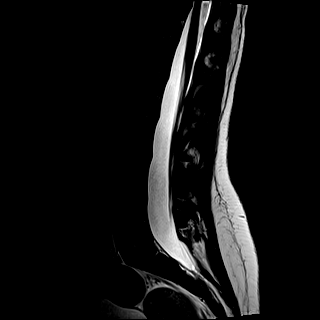
[im 11/14]
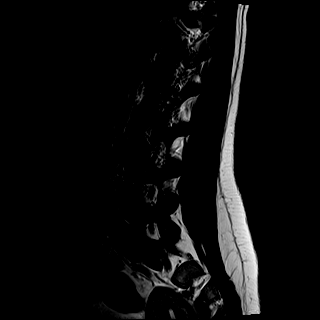
[im 14/14]
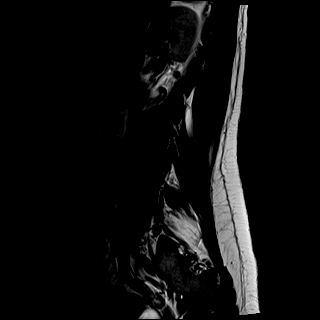

[Series 6: T1 · sagittal · 4.0mm · 0.81mm/px · 6 of 14 slices shown (1 of 2)]
[im 1/14]
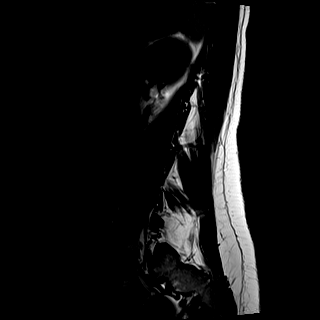
[im 3/14]
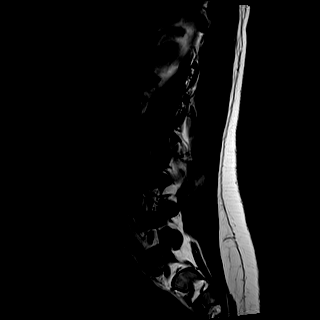
[im 6/14]
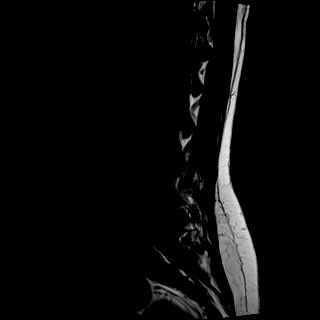
[im 8/14]
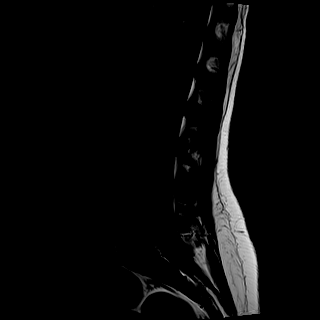
[im 11/14]
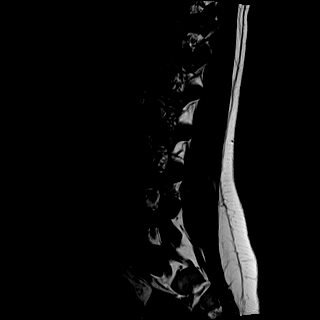
[im 14/14]
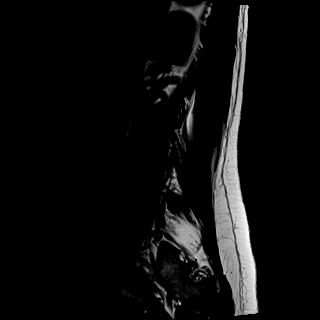

[Series 7: STIR · sagittal · 4.0mm · 0.41mm/px · 2 of 14 slices shown]
[im 1/14]
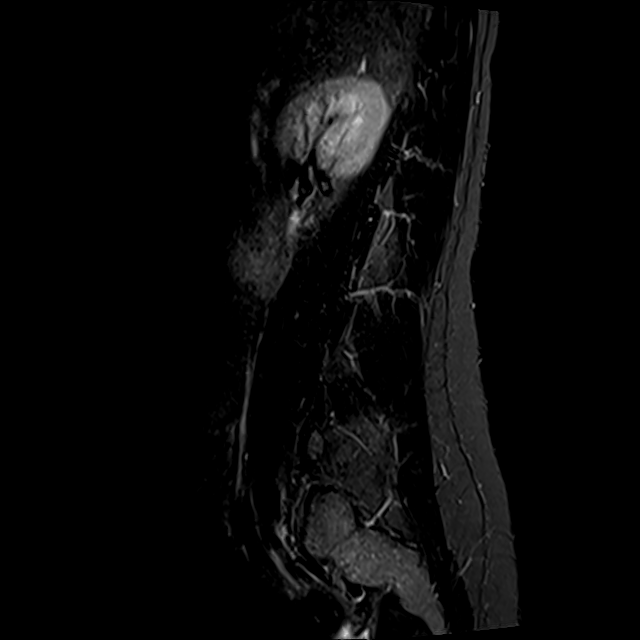
[im 3/14]
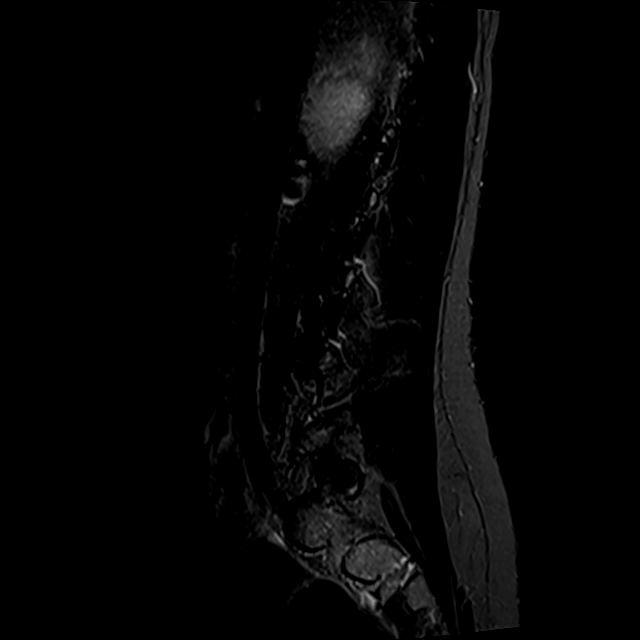

[Series 8: T2 · axial · 4.0mm · 0.78mm/px · z∈[-678,-450]mm · 9 of 36 slices shown (2 of 2)]
[im 1/36]
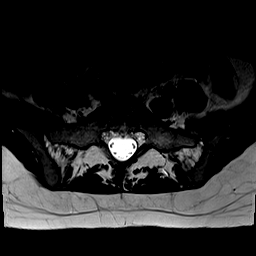
[im 6/36]
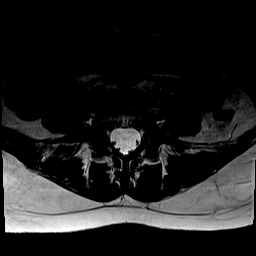
[im 11/36]
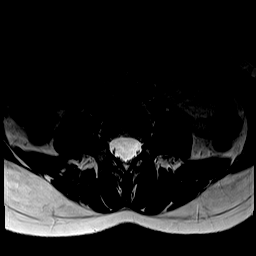
[im 16/36]
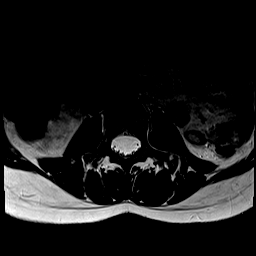
[im 18/36]
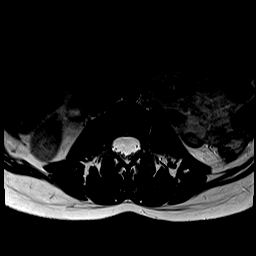
[im 21/36]
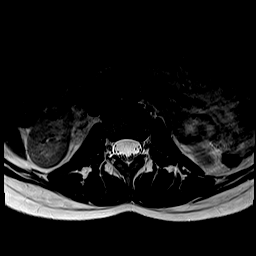
[im 26/36]
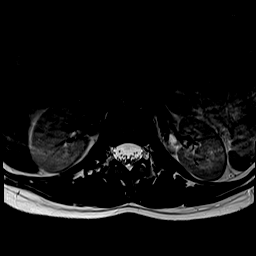
[im 31/36]
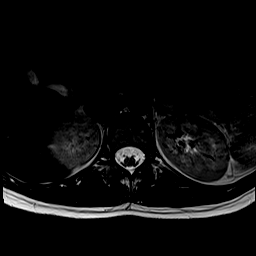
[im 36/36]
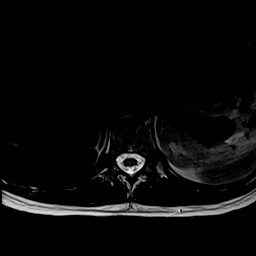

[Series 9: T1 · axial · 4.0mm · 0.39mm/px · z∈[-678,-450]mm · 9 of 36 slices shown (2 of 2)]
[im 1/36]
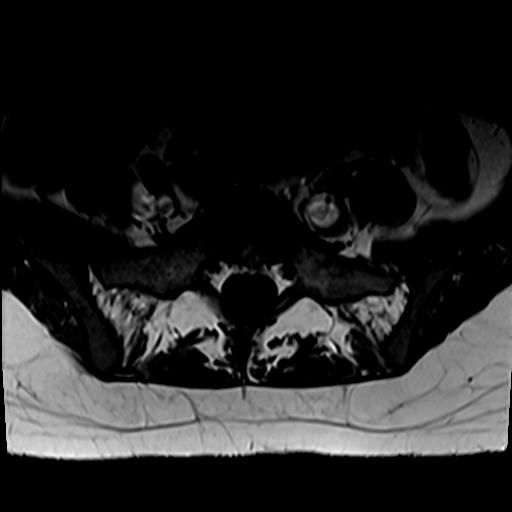
[im 6/36]
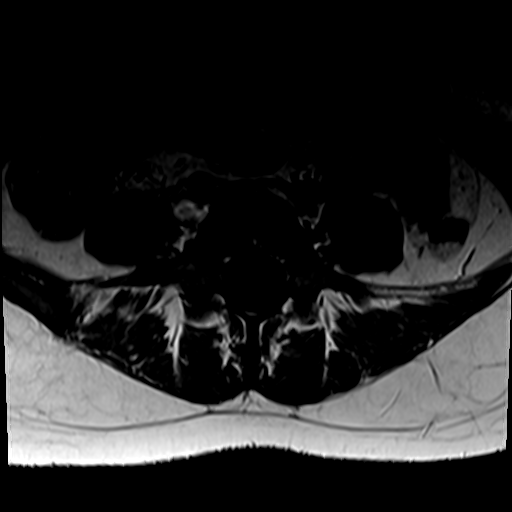
[im 11/36]
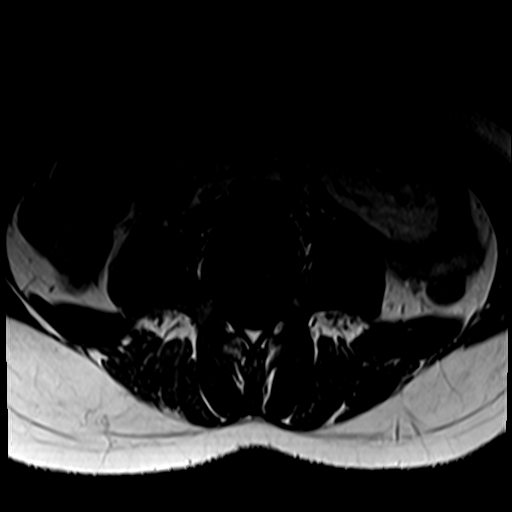
[im 16/36]
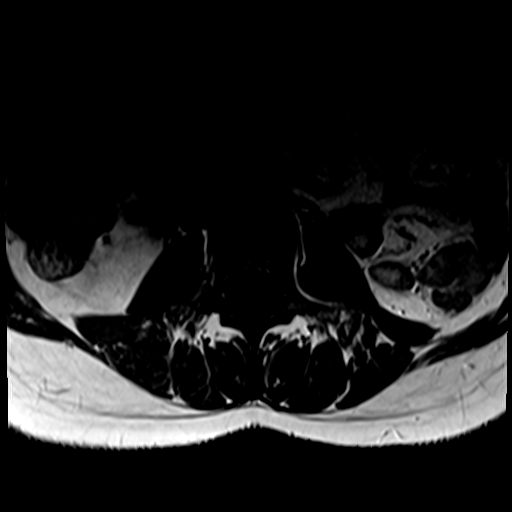
[im 18/36]
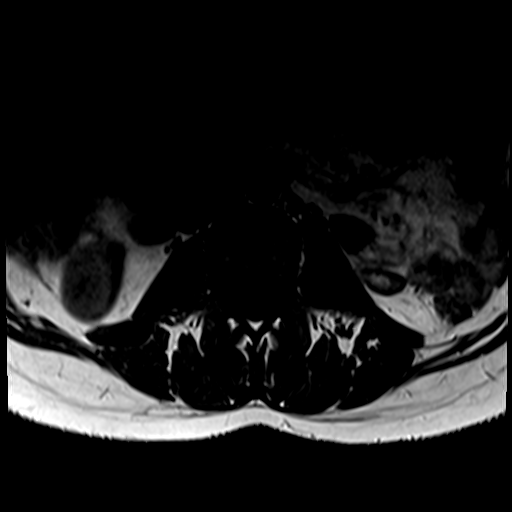
[im 21/36]
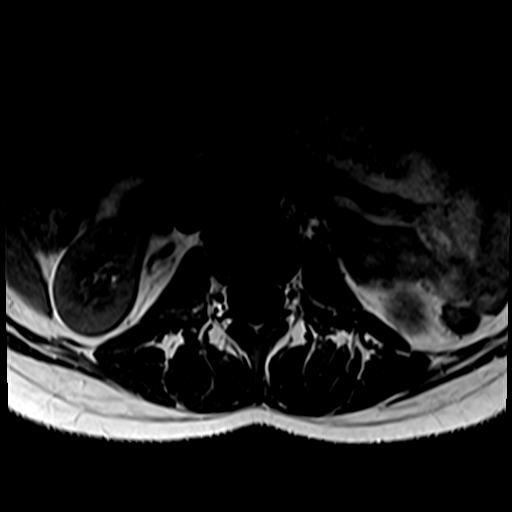
[im 26/36]
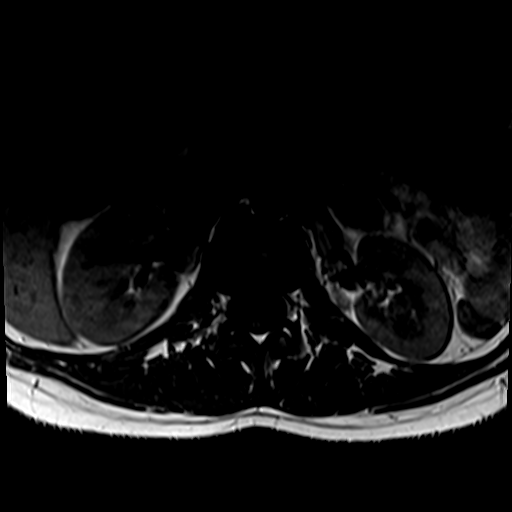
[im 31/36]
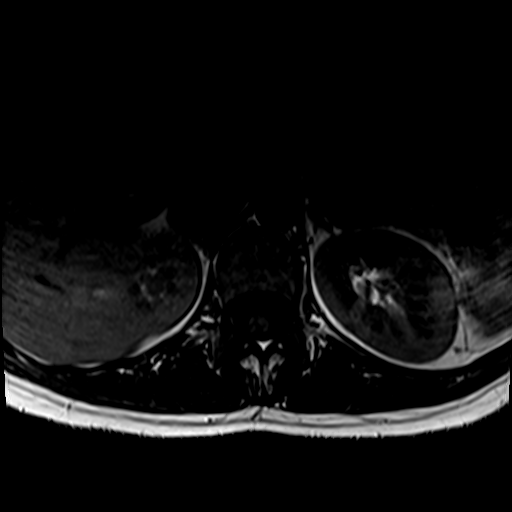
[im 36/36]
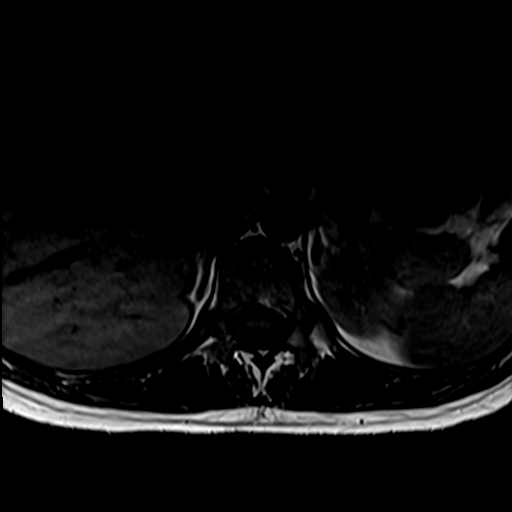

[32 of 48 positions shown; findings below may reference images not displayed]

FINDINGS: Segmentation:  Standard.

Alignment:  Physiologic.

Vertebrae:  No fracture, evidence of discitis, or bone lesion.

Conus medullaris and cauda equina: Conus extends to the L2 level.
Conus and cauda equina appear normal.

Paraspinal and other soft tissues: Negative

Disc levels:

No spinal canal or neural foraminal stenosis.  No disc herniation.
IMPRESSION: Normal lumbar spine MRI.

## 2021-05-21 MED ORDER — BUTALBITAL-APAP-CAFFEINE 50-325-40 MG PO TABS: 1.0000 | ORAL_TABLET | Freq: Four times a day (QID) | ORAL | 0 refills | Status: DC | PRN

## 2021-05-21 MED ORDER — KETOROLAC TROMETHAMINE 30 MG/ML IJ SOLN
15.0000 mg | Freq: Once | INTRAMUSCULAR | Status: AC
  Administered 2021-05-21: 15 mg via INTRAVENOUS
  Filled 2021-05-21: qty 1

## 2021-05-21 MED ORDER — LORAZEPAM 2 MG/ML IJ SOLN
INTRAMUSCULAR | Status: AC
  Administered 2021-05-21: 1 mg via INTRAVENOUS
  Filled 2021-05-21: qty 1

## 2021-05-21 MED ORDER — LORAZEPAM 2 MG/ML IJ SOLN: 1.0000 mg | Freq: Once | INTRAMUSCULAR | Status: AC

## 2021-05-21 MED ORDER — SODIUM CHLORIDE 0.9 % IV BOLUS
500.0000 mL | Freq: Once | INTRAVENOUS | Status: AC
  Administered 2021-05-21: 500 mL via INTRAVENOUS

## 2021-05-21 NOTE — Discharge Instructions (Addendum)
You may take Fioricet as needed for headache.  Apply ice pack to nape of neck as needed for headache.  Return to the ER for worsening symptoms, persistent vomiting, lethargy or other concerns.

## 2021-06-12 ENCOUNTER — Emergency Department
Admission: EM | Admit: 2021-06-12 | Discharge: 2021-06-12 | Disposition: A | Discharge status: Home / Self Care | Payer: BC Managed Care – PPO | Attending: Emergency Medicine | Admitting: Emergency Medicine

## 2021-06-12 ENCOUNTER — Inpatient Hospital Stay
Admission: RE | Admit: 2021-06-12 | Discharge: 2021-06-12 | Disposition: A | Discharge status: Home / Self Care | Payer: Self-pay | Source: Ambulatory Visit

## 2021-06-12 ENCOUNTER — Ambulatory Visit: Admit: 2021-06-12 | Discharge status: Absent without leave | Payer: BC Managed Care – PPO

## 2021-06-12 ENCOUNTER — Other Ambulatory Visit: Payer: Self-pay

## 2021-06-12 DIAGNOSIS — N12 Tubulo-interstitial nephritis, not specified as acute or chronic: Secondary | ICD-10-CM | POA: Insufficient documentation

## 2021-06-12 DIAGNOSIS — Z87891 Personal history of nicotine dependence: Secondary | ICD-10-CM | POA: Diagnosis not present

## 2021-06-12 DIAGNOSIS — J45909 Unspecified asthma, uncomplicated: Secondary | ICD-10-CM | POA: Diagnosis not present

## 2021-06-12 DIAGNOSIS — M549 Dorsalgia, unspecified: Secondary | ICD-10-CM | POA: Diagnosis present

## 2021-06-12 LAB — URINALYSIS, MICROSCOPIC (REFLEX)
RBC / HPF: >50 RBC/hpf (ref 0–5)
WBC, UA: >50 WBC/hpf (ref 0–5)

## 2021-06-12 LAB — URINALYSIS, ROUTINE W REFLEX MICROSCOPIC
Bilirubin Urine: NEGATIVE
Glucose, UA: NEGATIVE mg/dL
Ketones, ur: NEGATIVE mg/dL
Nitrite: NEGATIVE
Protein, ur: 100 mg/dL — AB
Specific Gravity, Urine: 1.02 (ref 1.005–1.030)
pH: 6.5 (ref 5.0–8.0)

## 2021-06-12 LAB — PREGNANCY, URINE: Preg Test, Ur: NEGATIVE

## 2021-06-12 LAB — POC URINE PREG, ED: Preg Test, Ur: NEGATIVE

## 2021-06-12 MED ORDER — ONDANSETRON 4 MG PO TBDP
4.0000 mg | ORAL_TABLET | Freq: Once | ORAL | Status: AC
  Administered 2021-06-12: 4 mg via ORAL
  Filled 2021-06-12: qty 1

## 2021-06-12 MED ORDER — CEFDINIR 300 MG PO CAPS
300.0000 mg | ORAL_CAPSULE | Freq: Once | ORAL | Status: AC
  Administered 2021-06-12: 300 mg via ORAL
  Filled 2021-06-12: qty 1

## 2021-06-12 MED ORDER — IBUPROFEN 400 MG PO TABS
400.0000 mg | ORAL_TABLET | Freq: Once | ORAL | Status: AC
  Administered 2021-06-12: 400 mg via ORAL
  Filled 2021-06-12: qty 1

## 2021-06-12 MED ORDER — CEFDINIR 300 MG PO CAPS: 300.0000 mg | ORAL_CAPSULE | Freq: Two times a day (BID) | ORAL | 0 refills | Status: AC

## 2021-06-12 NOTE — ED Notes (Signed)
This RN called lab to have urine cx added to urine sample already in lab.

## 2021-06-12 NOTE — ED Triage Notes (Signed)
Pt in reporting UTI-like symptoms stating burning when she urinates and back pain. Reports was going to be seen at [facility] for this but that it was taking too long there so she came here. VSS. Denies fever.

## 2021-06-12 NOTE — ED Provider Notes (Addendum)
Emergency Medicine Provider Triage Evaluation Note  Rita Dunn , a 20 y.o. female  was evaluated in triage.  Pt complains of dysuria, hematuria, and bilateral flank tenderness. She reports intermittent fevers, and vomiting. She initially reported to a local urgent care center, but had a near-syncopal episode, and was referred to the ED.   Review of Systems  Positive: Dysuria, hematuria, vomiting Negative: Chest pain  Physical Exam  There were no vitals taken for this visit. Gen:   Awake, no distress  NAD Resp:  Normal effort CTA MSK:   Moves extremities without difficulty  Other:  ABD: soft, nontender  Medical Decision Making  Medically screening exam initiated at 1:31 PM.  Appropriate orders placed.  Rita Dunn was informed that the remainder of the evaluation will be completed by another provider, this initial triage assessment does not replace that evaluation, and the importance of remaining in the ED until their evaluation is complete.  Patient with ED evaluation of dysuria & hematuria.    Lissa Hoard, PA-C 06/12/21 [address] Charlesetta Ivory, PA-C 06/12/21 1331    Shaune Pollack, MD 06/13/21 2256

## 2021-06-12 NOTE — ED Provider Notes (Signed)
[facility]  ____________________________________________   Event Date/Time   First MD Initiated Contact with Patient 06/12/21 [phone]     (approximate)  I have reviewed the triage vital signs and the nursing notes.   HISTORY  Chief Complaint Back Pain (UTI symptoms)    HPI Rita Dunn is a 20 y.o. female with past medical history of anxiety, PTSD who presents with dysuria and flank pain.  Symptoms started several days ago with just urgency frequency and dysuria.  During the night she developed bilateral flank pain.  She had an episode of emesis earlier but now is tolerating p.o.  Denies fevers or chills.  She is not had any diarrhea or constipation.  Denies abnormal vaginal discharge.         Past Medical History:  Diagnosis Date   ADHD    Anxiety    Asthma    PTSD (post-traumatic stress disorder)     There are no problems to display for this patient.   Past Surgical History:  Procedure Laterality Date   WISDOM TOOTH EXTRACTION      Prior to Admission medications   Medication Sig Start Date End Date Taking? Authorizing Provider  cefdinir (OMNICEF) 300 MG capsule Take 1 capsule (300 mg total) by mouth 2 (two) times daily for 7 days. 06/12/21 06/19/21 Yes Georga Hacking, MD  butalbital-acetaminophen-caffeine (FIORICET) [phone] MG tablet Take 1 tablet by mouth every 6 (six) hours as needed for headache. 05/21/21   Irean Hong, MD  doxycycline (ADOXA) 100 MG tablet Take 1 tablet (100 mg total) by mouth 2 (two) times daily. 12/16/20   Faythe Ghee, PA-C    Allergies Penicillins  No family history on file.  Social History Social History   Tobacco Use   Smoking status: Former    Types: Cigarettes   Smokeless tobacco: Never  Substance Use Topics   Alcohol use: Yes   Drug use: Never    Review of Systems   Review of Systems  Constitutional:  Positive for fatigue. Negative for appetite change, chills and fever.  Gastrointestinal:   Positive for abdominal pain, nausea and vomiting.  Genitourinary:  Positive for dysuria, flank pain, frequency and urgency.  All other systems reviewed and are negative.  Physical Exam Updated Vital Signs BP 112/70 (BP Location: Right Arm)   Pulse 86   Temp 98.2 F (36.8 C) (Oral)   Resp 17   Ht 5\' 4"  (1.626 m)   Wt 54.4 kg   SpO2 97%   BMI 20.60 kg/m   Physical Exam Vitals and nursing note reviewed.  Constitutional:      General: She is not in acute distress.    Appearance: Normal appearance.  HENT:     Head: Normocephalic and atraumatic.  Eyes:     General: No scleral icterus.    Conjunctiva/sclera: Conjunctivae normal.  Pulmonary:     Effort: Pulmonary effort is normal. No respiratory distress.     Breath sounds: No stridor.  Abdominal:     General: Abdomen is flat.     Palpations: Abdomen is soft.     Comments: Abdomen is soft without guarding, mild tenderness to palpation in the bilateral lower quadrants and suprapubic region  She has minimal right-sided CVA tenderness  Musculoskeletal:        General: No deformity or signs of injury.     Cervical back: Normal range of motion.  Skin:    General: Skin is dry.  Coloration: Skin is not jaundiced or pale.  Neurological:     General: No focal deficit present.     Mental Status: She is alert and oriented to person, place, and time. Mental status is at baseline.  Psychiatric:        Mood and Affect: Mood normal.        Behavior: Behavior normal.     LABS (all labs ordered are listed, but only abnormal results are displayed)  Labs Reviewed  URINALYSIS, ROUTINE W REFLEX MICROSCOPIC - Abnormal; Notable for the following components:      Result Value   Hgb urine dipstick LARGE (*)    Protein, ur 100 (*)    Leukocytes,Ua LARGE (*)    All other components within normal limits  URINALYSIS, MICROSCOPIC (REFLEX) - Abnormal; Notable for the following components:   Bacteria, UA RARE (*)    All other components  within normal limits  URINE CULTURE  PREGNANCY, URINE  POC URINE PREG, ED   ____________________________________________  EKG  N/a ____________________________________________  RADIOLOGY Ky Barban, personally viewed and evaluated these images (plain radiographs) as part of my medical decision making, as well as reviewing the written report by the radiologist.  ED MD interpretation: Limited bedside renal ultrasound that was negative for hydronephrosis bilaterally    ____________________________________________   PROCEDURES  Procedure(s) performed (including Critical Care):  Procedures   ____________________________________________   INITIAL IMPRESSION / ASSESSMENT AND PLAN / ED COURSE     20 year old female presents with dysuria and flank pain.  Her UA is grossly positive.  She started with dysuria which then progressed to flank pain consistent with pyelonephritis.  She is very well-appearing afebrile tolerating p.o.  Her abdomen is benign but she does have some tenderness in the bilateral lower quadrants, does not localize to the right lower quadrant.  I performed a bedside ultrasound to evaluate for hydronephrosis to rule out obstructing stone which is negative.  Additionally my suspicion for stone based on her clinical presentation is very low.  Will treat as pyelonephritis with a 7-day course of cefdinir.  We discussed return precautions.  Patient was agreeable with the plan.  Clinical Course as of 06/12/21 1948  Tue Jun 12, 2021  1657 Hgb urine dipstick(!): LARGE [KM]    Clinical Course User Index [KM] Georga Hacking, MD     ____________________________________________   FINAL CLINICAL IMPRESSION(S) / ED DIAGNOSES  Final diagnoses:  Pyelonephritis     ED Discharge Orders          Ordered    cefdinir (OMNICEF) 300 MG capsule  2 times daily        06/12/21 1735             Note:  This document was prepared using Dragon voice  recognition software and may include unintentional dictation errors.    Georga Hacking, MD 06/12/21 [phone]

## 2021-06-15 LAB — URINE CULTURE: Culture: >=100000 — AB

## 2021-06-26 ENCOUNTER — Telehealth: Payer: Self-pay

## 2021-06-26 NOTE — Telephone Encounter (Signed)
Father of patient called asking if Dr Salomon Fick would accept daughter as a new patient she was referred by Francesco Sor  Call back # [phone] Karleen Hampshire

## 2021-07-02 NOTE — Telephone Encounter (Signed)
Father of patient was calling back to see if we had heard anything from Dr. Salomon Fick yet about his daughter becoming a patient.

## 2021-07-03 NOTE — Telephone Encounter (Signed)
Spoke to patient's father and made him aware that Dr. Salomon Fick isn't accepting new patients.  He is aware and stated his understanding.

## 2022-10-05 ENCOUNTER — Ambulatory Visit (INDEPENDENT_AMBULATORY_CARE_PROVIDER_SITE_OTHER): Payer: BC Managed Care – PPO | Admitting: Oncology

## 2022-10-05 ENCOUNTER — Other Ambulatory Visit: Payer: Self-pay

## 2022-10-05 VITALS — BP 108/58 | HR 77 | Temp 96.2°F | Ht 66.54 in | Wt 132.0 lb

## 2022-10-05 DIAGNOSIS — R319 Hematuria, unspecified: Secondary | ICD-10-CM

## 2022-10-05 LAB — POCT URINALYSIS DIPSTICK (MANUAL)
Leukocytes, UA: NEGATIVE
Nitrite, UA: NEGATIVE
Poct Bilirubin: NEGATIVE
Poct Blood: NEGATIVE
Poct Glucose: NORMAL mg/dL
Poct Ketones: NEGATIVE
Poct Protein: NEGATIVE mg/dL
Poct Urobilinogen: NORMAL mg/dL
Spec Grav, UA: 1.015 (ref 1.010–1.025)
pH, UA: 7.5 (ref 5.0–8.0)

## 2022-10-05 MED ORDER — SULFAMETHOXAZOLE-TRIMETHOPRIM 800-160 MG PO TABS: 1.0000 | ORAL_TABLET | Freq: Two times a day (BID) | ORAL | 0 refills | Status: DC

## 2022-10-05 NOTE — Progress Notes (Signed)
[address] [place], [place] [address] Phone: [phone] Fax: [phone]   Office Visit Note  Patient Name: Rita Dunn  Date of [id number]  Med Rec number [id number]  Date of Service: 10/05/2022  Penicillins  No chief complaint on file.  Patient is an 22 y.o. student here for complaints of low back pain, increased urinary frequency and blood in urine that she noticed 2 days ago. Has had several UTI's in the past that has developed into kidney infections so she is always nervous when she develops similar symptoms. Last UTI was in the spring. No recent antibiotics. No fevers.  No nausea vomiting or diarrhea.  No fevers.  Sexually active.  No recent STI testing.  No concerns for STIs at this time.  Current Medication:  Outpatient Encounter Medications as of 10/05/2022  Medication Sig   butalbital-acetaminophen-caffeine (FIORICET) 50-325-40 MG tablet Take 1 tablet by mouth every 6 (six) hours as needed for headache.   doxycycline (ADOXA) 100 MG tablet Take 1 tablet (100 mg total) by mouth 2 (two) times daily.   No facility-administered encounter medications on file as of 10/05/2022.      Medical History: Past Medical History:  Diagnosis Date   ADHD    Anxiety    Asthma    PTSD (post-traumatic stress disorder)      Vital Signs: There were no vitals taken for this visit.  ROS: As per HPI.  All other pertinent ROS negative.     Review of Systems  Constitutional:  Negative for fever.  Genitourinary:  Positive for flank pain, frequency, hematuria and pelvic pain. Negative for dysuria.    Physical Exam Constitutional:      Appearance: Normal appearance.  Abdominal:     Tenderness: There is right CVA tenderness and left CVA tenderness.  Neurological:     Mental Status: She is alert.     No results found for this or any previous visit (from the past 24 hour(s)).  Assessment/Plan: 1. Hematuria, unspecified type -Symptoms consistent with a  UTI.  Exam is benign.  Urinalysis was negative.  Will send for urine culture.  Discussed sending prescription for Bactrim twice daily x 7 days to use if needed given her history of kidney infections.  We also talked about alternative such as Azo, drinking water and cranberry tablets or juice.  Let me know if you decide to start taking antibiotics.  Will send results of culture via MyChart when available.  - POCT Urinalysis Dip Manual   Disposition-return to clinic as needed.  General Counseling: jaimi belle understanding of the findings of todays visit and agrees with plan of treatment. I have discussed any further diagnostic evaluation that may be needed or ordered today. We also reviewed her medications today. she has been encouraged to call the office with any questions or concerns that should arise related to todays visit.   No orders of the defined types were placed in this encounter.   No orders of the defined types were placed in this encounter.  I spent 20 minutes dedicated to the care of this patient (face-to-face and non-face-to-face) on the date of the encounter to include what is described in the assessment and plan.   Faythe Casa, NP 10/05/2022 10:10 AM

## 2022-10-07 LAB — URINE CULTURE

## 2022-10-09 ENCOUNTER — Encounter: Payer: Self-pay | Admitting: Oncology

## 2022-10-09 ENCOUNTER — Ambulatory Visit (INDEPENDENT_AMBULATORY_CARE_PROVIDER_SITE_OTHER): Payer: BC Managed Care – PPO | Admitting: Oncology

## 2022-10-09 VITALS — HR 103 | Temp 97.1°F | Ht 66.0 in | Wt 132.0 lb

## 2022-10-09 DIAGNOSIS — J0110 Acute frontal sinusitis, unspecified: Secondary | ICD-10-CM | POA: Diagnosis not present

## 2022-10-09 LAB — POC SOFIA 2 FLU + SARS ANTIGEN FIA
Influenza A, POC: NEGATIVE
Influenza B, POC: NEGATIVE
SARS Coronavirus 2 Ag: NEGATIVE

## 2022-10-09 MED ORDER — CEFDINIR 300 MG PO CAPS: 300.0000 mg | ORAL_CAPSULE | Freq: Two times a day (BID) | ORAL | 0 refills | Status: DC

## 2022-10-09 NOTE — Progress Notes (Signed)
[address] [place], [place] [address] Phone: [phone] Fax: [phone]   Office Visit Note  Patient Name: Rita Dunn  Date of [id number]  Med Rec number [id number]  Date of Service: 10/09/2022  Penicillins  No chief complaint on file.  Patient is an 22 y.o. student here for complaints of feeling "run down" X 2 days. Developed ST yesterday. Feeling really tired. Developed some headache, sinus pain and congestion today. No sick contacts. Has been taking sudafed and tylenol/Advil. Took last dose about 30 minutes ago.   Has history of sinus infection.   Had chronic Sinus infection summer 2022.   She was evaluated last week for possible UTI.  Has history of kidney infections from undiagnosed UTI.  Urinalysis and urine culture were negative.  She never started antibiotics and her symptoms have completely resolved.  Current Medication:  Outpatient Encounter Medications as of 10/09/2022  Medication Sig   sulfamethoxazole-trimethoprim (BACTRIM DS) 800-160 MG tablet Take 1 tablet by mouth 2 (two) times daily.   No facility-administered encounter medications on file as of 10/09/2022.      Medical History: Past Medical History:  Diagnosis Date   ADHD    Anxiety    Asthma    PTSD (post-traumatic stress disorder)      Vital Signs: There were no vitals taken for this visit.  ROS: As per HPI.  All other pertinent ROS negative.     Review of Systems  Constitutional:  Positive for fatigue. Negative for chills and diaphoresis.  HENT:  Positive for congestion, postnasal drip, sinus pressure, sinus pain and sore throat.   Respiratory:  Negative for cough.   Gastrointestinal:  Positive for diarrhea. Negative for nausea and vomiting.  Musculoskeletal:  Positive for myalgias.  Neurological:  Positive for dizziness and headaches.    Physical Exam Constitutional:      Appearance: Normal appearance.  HENT:     Right Ear: Tympanic membrane normal.      Left Ear: Tympanic membrane normal.     Nose: Congestion present.     Right Turbinates: Swollen.     Left Turbinates: Swollen.     Right Sinus: Maxillary sinus tenderness and frontal sinus tenderness present.     Left Sinus: Maxillary sinus tenderness and frontal sinus tenderness present.  Neurological:     Mental Status: She is alert.     No results found for this or any previous visit (from the past 24 hour(s)).  Assessment/Plan: 1. Acute non-recurrent frontal sinusitis -Exam concerning for frontal sinusitis.   -Flu and COVID-negative. -Discussed cefdinir 3 mg twice daily x 7 days.  Has family history of allergy to penicillins. Unknown reaction type.  Recommend over-the-counter Flonase 2 sprays each nostril daily and some sort of decongestant such as Mucinex or Sudafed for congestion.  Please take ibuprofen or Tylenol for headache or sore throat.  - cefdinir (OMNICEF) 300 MG capsule; Take 1 capsule (300 mg total) by mouth 2 (two) times daily.  Dispense: 14 capsule; Refill: 0 - POC SOFIA 2 FLU + SARS ANTIGEN FIA   Disposition-RTC PRN   General Counseling: Daniah verbalizes understanding of the findings of todays visit and agrees with plan of treatment. I have discussed any further diagnostic evaluation that may be needed or ordered today. We also reviewed her medications today. she has been encouraged to call the office with any questions or concerns that should arise related to todays visit.   No orders of the defined types were placed in  this encounter.   No orders of the defined types were placed in this encounter.   I spent 20 minutes dedicated to the care of this patient (face-to-face and non-face-to-face) on the date of the encounter to include what is described in the assessment and plan.   Faythe Casa, NP 10/09/2022 2:32 PM

## 2022-10-29 ENCOUNTER — Encounter: Payer: Self-pay | Admitting: Oncology

## 2022-10-29 ENCOUNTER — Ambulatory Visit (INDEPENDENT_AMBULATORY_CARE_PROVIDER_SITE_OTHER): Payer: BC Managed Care – PPO | Admitting: Oncology

## 2022-10-29 VITALS — Temp 98.5°F | Ht 65.0 in | Wt 130.0 lb

## 2022-10-29 DIAGNOSIS — J029 Acute pharyngitis, unspecified: Secondary | ICD-10-CM | POA: Diagnosis not present

## 2022-10-29 DIAGNOSIS — R051 Acute cough: Secondary | ICD-10-CM

## 2022-10-29 LAB — POCT RAPID STREP A (OFFICE): Rapid Strep A Screen: NEGATIVE

## 2022-10-29 LAB — POC SOFIA 2 FLU + SARS ANTIGEN FIA
Influenza A, POC: NEGATIVE
Influenza B, POC: NEGATIVE
SARS Coronavirus 2 Ag: NEGATIVE

## 2022-10-29 MED ORDER — AZITHROMYCIN 250 MG PO TABS: ORAL_TABLET | ORAL | 0 refills | Status: AC

## 2022-10-29 MED ORDER — PSEUDOEPH-BROMPHEN-DM 30-2-10 MG/5ML PO SYRP: 5.0000 mL | ORAL_SOLUTION | Freq: Four times a day (QID) | ORAL | 0 refills | Status: AC | PRN

## 2022-10-29 NOTE — Progress Notes (Signed)
[address]. [place], [place] [address] Phone: [phone] Fax: [phone]   Office Visit Note  Patient Name: Rita Dunn  Date of [id number]  Med Rec number [id number]  Date of Service: 10/29/2022  Penicillins  No chief complaint on file.  Patient is an 22 y.o. student here for complaints of sore throat, congestion, cough, fever with chills that started 1-2 days ago. Treated for sinus infection 1/17 with Keflex X 7 days. Sx fully improved.  Has not had any medications today. Took Flonase, advil, vitamin C and mucinex yesterday. Shared a drink with someone over the weekend who tested positive for strep.   Had flu over winter break.   Current Medication:  Outpatient Encounter Medications as of 10/29/2022  Medication Sig   cefdinir (OMNICEF) 300 MG capsule Take 1 capsule (300 mg total) by mouth 2 (two) times daily.   sulfamethoxazole-trimethoprim (BACTRIM DS) 800-160 MG tablet Take 1 tablet by mouth 2 (two) times daily.   No facility-administered encounter medications on file as of 10/29/2022.      Medical History: Past Medical History:  Diagnosis Date   ADHD    Anxiety    Asthma    PTSD (post-traumatic stress disorder)      Vital Signs: There were no vitals taken for this visit.  ROS: As per HPI.  All other pertinent ROS negative.     Review of Systems  Constitutional:  Positive for chills, diaphoresis and fatigue.  HENT:  Positive for congestion, sinus pressure, sinus pain and sore throat.   Respiratory:  Positive for cough.   Gastrointestinal:  Negative for diarrhea, nausea and vomiting.  Musculoskeletal:  Positive for myalgias.  Neurological:  Positive for headaches. Negative for dizziness.    Physical Exam Vitals reviewed.  Constitutional:      Appearance: Normal appearance. She is well-developed.  HENT:     Right Ear: Tympanic membrane normal.     Left Ear: Tympanic membrane normal.     Nose: Congestion and rhinorrhea present.      Right Turbinates: Swollen.     Left Turbinates: Swollen.     Mouth/Throat:     Mouth: Mucous membranes are moist.     Pharynx: Pharyngeal swelling and posterior oropharyngeal erythema present.     Tonsils: No tonsillar exudate. 1+ on the right. 1+ on the left.  Cardiovascular:     Rate and Rhythm: Normal rate.  Pulmonary:     Effort: Pulmonary effort is normal.     Breath sounds: Normal breath sounds.  Lymphadenopathy:     Cervical: Cervical adenopathy present.  Neurological:     Mental Status: She is alert.     No results found for this or any previous visit (from the past 24 hour(s)).  Assessment/Plan: 1. Sore throat -Strep negative.  -Covid/flu negative.  -Labs today to rule out out bacterial versus viral infection.  Discussed mono in detail given recurrent symptoms.  -Will go ahead and start on azithromycin 250 mg tabs. Take 2 tabs on day 1 followed by 1 tab until complete. Take with food.   - CBC w/Diff - Comp Met (CMET) - EBV ab to viral capsid ag pnl, IgG+IgM - POC SOFIA 2 FLU + SARS ANTIGEN FIA - POCT rapid strep A - azithromycin (ZITHROMAX) 250 MG tablet; Take 2 tablets on day 1, then 1 tablet daily on days 2 through 5  Dispense: 6 tablet; Refill: 0 - brompheniramine-pseudoephedrine-DM 30-2-10 MG/5ML syrup; Take 5 mLs by mouth 4 (four) times  daily as needed.  Dispense: 120 mL; Refill: 0  2. Acute cough - Start antibiotics today.  Discussed how to take.  Take 2 today followed by 1 every day until done. -Take Bromed cough medicine as needed every 6 hours. -Continue Flonase 2 sprays each nostril daily and Mucinex or DayQuil to help with congestion. -Collect labs today and will send results via MyChart when available.  - CBC w/Diff - Comp Met (CMET) - POC SOFIA 2 FLU + SARS ANTIGEN FIA - azithromycin (ZITHROMAX) 250 MG tablet; Take 2 tablets on day 1, then 1 tablet daily on days 2 through 5  Dispense: 6 tablet; Refill: 0 - brompheniramine-pseudoephedrine-DM 30-2-10  MG/5ML syrup; Take 5 mLs by mouth 4 (four) times daily as needed.  Dispense: 120 mL; Refill: 0  Disposition-RTC PRN.   General Counseling: shaneya taketa understanding of the findings of todays visit and agrees with plan of treatment. I have discussed any further diagnostic evaluation that may be needed or ordered today. We also reviewed her medications today. she has been encouraged to call the office with any questions or concerns that should arise related to todays visit.   No orders of the defined types were placed in this encounter.   No orders of the defined types were placed in this encounter.   I spent 20 minutes dedicated to the care of this patient (face-to-face and non-face-to-face) on the date of the encounter to include what is described in the assessment and plan.   Faythe Casa, NP 10/29/2022 1:17 PM

## 2022-10-30 LAB — CBC WITH DIFFERENTIAL/PLATELET
Basophils Absolute: 0 10*3/uL (ref 0.0–0.2)
Basos: 1 %
EOS (ABSOLUTE): 0.3 10*3/uL (ref 0.0–0.4)
Eos: 3 %
Hematocrit: 41.6 % (ref 34.0–46.6)
Hemoglobin: 14 g/dL (ref 11.1–15.9)
Immature Grans (Abs): 0 10*3/uL (ref 0.0–0.1)
Immature Granulocytes: 0 %
Lymphocytes Absolute: 1 10*3/uL (ref 0.7–3.1)
Lymphs: 12 %
MCH: 30.8 pg (ref 26.6–33.0)
MCHC: 33.7 g/dL (ref 31.5–35.7)
MCV: 91 fL (ref 79–97)
Monocytes Absolute: 0.9 10*3/uL (ref 0.1–0.9)
Monocytes: 11 %
Neutrophils Absolute: 6 10*3/uL (ref 1.4–7.0)
Neutrophils: 73 %
Platelets: 247 10*3/uL (ref 150–450)
RBC: 4.55 x10E6/uL (ref 3.77–5.28)
RDW: 11.8 % (ref 11.7–15.4)
WBC: 8.3 10*3/uL (ref 3.4–10.8)

## 2022-10-30 LAB — COMPREHENSIVE METABOLIC PANEL
ALT: 8 IU/L (ref 0–32)
AST: 15 IU/L (ref 0–40)
Albumin/Globulin Ratio: 1.8 (ref 1.2–2.2)
Albumin: 4.7 g/dL (ref 4.0–5.0)
Alkaline Phosphatase: 83 IU/L (ref 44–121)
BUN/Creatinine Ratio: 10 (ref 9–23)
BUN: 6 mg/dL (ref 6–20)
Bilirubin Total: 0.5 mg/dL (ref 0.0–1.2)
CO2: 21 mmol/L (ref 20–29)
Calcium: 9.6 mg/dL (ref 8.7–10.2)
Chloride: 102 mmol/L (ref 96–106)
Creatinine, Ser: 0.63 mg/dL (ref 0.57–1.00)
Globulin, Total: 2.6 g/dL (ref 1.5–4.5)
Glucose: 92 mg/dL (ref 70–99)
Potassium: 3.7 mmol/L (ref 3.5–5.2)
Sodium: 141 mmol/L (ref 134–144)
Total Protein: 7.3 g/dL (ref 6.0–8.5)
eGFR: 129 mL/min/{1.73_m2} (ref >59)

## 2022-10-30 LAB — EBV AB TO VIRAL CAPSID AG PNL, IGG+IGM
EBV VCA IgG: 595 U/mL — ABNORMAL HIGH (ref 0.0–17.9)
EBV VCA IgM: <36 U/mL (ref 0.0–35.9)

## 2022-11-26 ENCOUNTER — Ambulatory Visit: Payer: BC Managed Care – PPO | Admitting: Oncology

## 2023-01-21 ENCOUNTER — Ambulatory Visit (INDEPENDENT_AMBULATORY_CARE_PROVIDER_SITE_OTHER): Payer: BC Managed Care – PPO | Admitting: Adult Health

## 2023-01-21 ENCOUNTER — Encounter: Payer: Self-pay | Admitting: Adult Health

## 2023-01-21 VITALS — HR 69 | Temp 97.4°F

## 2023-01-21 DIAGNOSIS — Z79899 Other long term (current) drug therapy: Secondary | ICD-10-CM

## 2023-01-21 LAB — POCT URINE PREGNANCY: Preg Test, Ur: NEGATIVE

## 2023-01-21 NOTE — Progress Notes (Signed)
[facility] [address] [place], [place] [address] Phone: [phone] Fax: [phone]   Office Visit Note  Patient Name: Rita Dunn  Date of Birth:18-Oct-2000  Med Rec number [id number]  Date of Service: 01/21/2023  Penicillins  Chief Complaint  Patient presents with   Labs Only     HPI  Patient is here for Urine Pregnancy for her acutane RX. She denies any issues or complaints at this time.   Current Medication:  Outpatient Encounter Medications as of 01/21/2023  Medication Sig   brompheniramine-pseudoephedrine-DM 30-2-10 MG/5ML syrup Take 5 mLs by mouth 4 (four) times daily as needed. (Patient not taking: Reported on 01/21/2023)   cefdinir (OMNICEF) 300 MG capsule Take 1 capsule (300 mg total) by mouth 2 (two) times daily. (Patient not taking: Reported on 10/29/2022)   sulfamethoxazole-trimethoprim (BACTRIM DS) 800-160 MG tablet Take 1 tablet by mouth 2 (two) times daily. (Patient not taking: Reported on 10/29/2022)   No facility-administered encounter medications on file as of 01/21/2023.      Medical History: Past Medical History:  Diagnosis Date   ADHD    Anxiety    Asthma    PTSD (post-traumatic stress disorder)      Vital Signs: Pulse 69   Temp (!) 97.4 F (36.3 C) (Tympanic)   SpO2 100%    Review of Systems  Constitutional:  Negative for chills, fatigue and fever.    Physical Exam Vitals and nursing note reviewed.  Constitutional:      Appearance: Normal appearance.  Neurological:     Mental Status: She is alert.  Psychiatric:        Mood and Affect: Mood normal.        Behavior: Behavior normal.        Thought Content: Thought content normal.    Results for orders placed or performed in visit on 01/21/23 (from the past 24 hour(s))  POCT urine pregnancy     Status: Normal   Collection Time: 01/21/23 12:57 PM  Result Value Ref Range   Preg Test, Ur Negative Negative    Assessment/Plan: 1. Long-term use of high-risk  medication Negative pregnancy, faxed to MD's office per patients information.  - POCT urine pregnancy     General Counseling: peta peachey understanding of the findings of todays visit and agrees with plan of treatment. I have discussed any further diagnostic evaluation that may be needed or ordered today. We also reviewed her medications today. she has been encouraged to call the office with any questions or concerns that should arise related to todays visit.   Orders Placed This Encounter  Procedures   POCT urine pregnancy    No orders of the defined types were placed in this encounter.   Time spent:15 Minutes Time spent includes review of chart, medications, test results, and follow up plan with the patient.    Johnna Acosta AGNP-C Nurse Practitioner

## 2023-08-04 ENCOUNTER — Ambulatory Visit (INDEPENDENT_AMBULATORY_CARE_PROVIDER_SITE_OTHER): Payer: BC Managed Care – PPO | Admitting: Physician Assistant

## 2023-08-04 ENCOUNTER — Encounter: Payer: Self-pay | Admitting: Physician Assistant

## 2023-08-04 VITALS — BP 104/68 | HR 68 | Temp 96.3°F | Ht 64.0 in | Wt 114.0 lb

## 2023-08-04 DIAGNOSIS — N760 Acute vaginitis: Secondary | ICD-10-CM | POA: Diagnosis not present

## 2023-08-04 DIAGNOSIS — N898 Other specified noninflammatory disorders of vagina: Secondary | ICD-10-CM

## 2023-08-04 DIAGNOSIS — R82998 Other abnormal findings in urine: Secondary | ICD-10-CM

## 2023-08-04 DIAGNOSIS — M545 Low back pain, unspecified: Secondary | ICD-10-CM | POA: Diagnosis not present

## 2023-08-04 DIAGNOSIS — K6289 Other specified diseases of anus and rectum: Secondary | ICD-10-CM

## 2023-08-04 DIAGNOSIS — R1030 Lower abdominal pain, unspecified: Secondary | ICD-10-CM | POA: Diagnosis not present

## 2023-08-04 DIAGNOSIS — M542 Cervicalgia: Secondary | ICD-10-CM

## 2023-08-04 DIAGNOSIS — B9689 Other specified bacterial agents as the cause of diseases classified elsewhere: Secondary | ICD-10-CM

## 2023-08-04 LAB — POCT URINALYSIS DIPSTICK
Bilirubin, UA: NEGATIVE
Blood, UA: NEGATIVE
Glucose, UA: NEGATIVE
Ketones, UA: NEGATIVE
Leukocytes, UA: NEGATIVE
Nitrite, UA: NEGATIVE
Protein, UA: POSITIVE — AB
Spec Grav, UA: 1.01 (ref 1.010–1.025)
Urobilinogen, UA: NEGATIVE U/dL — AB
pH, UA: 8 (ref 5.0–8.0)

## 2023-08-04 LAB — POCT WET PREP (WET MOUNT): Trichomonas Wet Prep HPF POC: ABSENT

## 2023-08-04 LAB — POCT URINE PREGNANCY: Preg Test, Ur: NEGATIVE

## 2023-08-04 MED ORDER — METRONIDAZOLE 500 MG PO TABS
500.0000 mg | ORAL_TABLET | Freq: Two times a day (BID) | ORAL | 0 refills | Status: AC
Start: 2023-08-04 — End: 2023-08-11

## 2023-08-04 NOTE — Progress Notes (Signed)
[facility] [address] [place], [place] [address] Phone: [phone] Fax: [phone]   Office Visit Note  Patient Name: Rita Dunn  Date of Birth:[birth year]  Med Rec number [id number]   Penicillins  Chief Complaint  Patient presents with   Back Pain    Lower and right sided pain associated with urine/vaginal odor x 2 days. Sharp pain    Other    Urinary/vaginal odor associated with back pain    22 year old patient here for testing for vaginal odor, back pain, dark urine.   No dysuria, no frequency / urgency  Pain in lower right back Vaginal odor - fishy No vaginal discharge that is abnormal for her   Dizziness, nausea - started after the vaginal odor   Having rectal pain during bowel movements  Neck pain - cervical spine with one vertebrate off   Sexually active, men, use condoms  Agreeable to STI testing   Discussed all STI testing - will circle back to RPR/HIV  Current Medication:  Outpatient Encounter Medications as of 08/04/2023  Medication Sig   metroNIDAZOLE (FLAGYL) 500 MG tablet Take 1 tablet (500 mg total) by mouth 2 (two) times daily for 7 days.   brompheniramine-pseudoephedrine-DM 30-2-10 MG/5ML syrup Take 5 mLs by mouth 4 (four) times daily as needed. (Patient not taking: Reported on 01/21/2023)   [DISCONTINUED] cefdinir (OMNICEF) 300 MG capsule Take 1 capsule (300 mg total) by mouth 2 (two) times daily. (Patient not taking: Reported on 10/29/2022)   [DISCONTINUED] sulfamethoxazole-trimethoprim (BACTRIM DS) 800-160 MG tablet Take 1 tablet by mouth 2 (two) times daily. (Patient not taking: Reported on 10/29/2022)   No facility-administered encounter medications on file as of 08/04/2023.      Medical History: Past Medical History:  Diagnosis Date   ADHD    Anxiety    Asthma    PTSD (post-traumatic stress disorder)      Vital Signs: BP 104/68   Pulse 68   Temp (!) 96.3 F (35.7 C)   Ht 5\' 4"  (1.626 m)   Wt 114 lb (51.7 kg)   SpO2  100%   BMI 19.57 kg/m    ROS negative unless otherwise indicated above.  Physical Exam Vitals reviewed.  Constitutional:      Appearance: Normal appearance.  Eyes:     Conjunctiva/sclera: Conjunctivae normal.  Abdominal:     General: There is no distension.     Palpations: There is no mass.     Tenderness: There is abdominal tenderness in the right upper quadrant, right lower quadrant, epigastric area, periumbilical area, suprapubic area and left lower quadrant. There is right CVA tenderness and left CVA tenderness. There is no guarding.     Hernia: No hernia is present.  Neurological:     Mental Status: She is alert.  Psychiatric:        Mood and Affect: Mood normal.        Behavior: Behavior normal.        Thought Content: Thought content normal.        Judgment: Judgment normal.     Results for orders placed or performed in visit on 08/04/23 (from the past 24 hour(s))  POCT Wet Prep [facility])     Status: Abnormal   Collection Time: 08/04/23 11:28 AM  Result Value Ref Range   Source Wet Prep POC     WBC, Wet Prep HPF POC     Bacteria Wet Prep HPF POC Few Few   BACTERIA WET  PREP MORPHOLOGY POC     Clue Cells Wet Prep HPF POC Few (A) None   Clue Cells Wet Prep [place] POC     Yeast Wet Prep HPF POC None None   KOH Wet Prep POC None None   Trichomonas Wet Prep HPF POC Absent Absent  POCT Urinalysis Dipstick     Status: Abnormal   Collection Time: 08/04/23 11:30 AM  Result Value Ref Range   Color, UA     Clarity, UA     Glucose, UA Negative Negative   Bilirubin, UA Negative    Ketones, UA Negative    Spec Grav, UA 1.010 1.010 - 1.025   Blood, UA Negative    pH, UA 8.0 5.0 - 8.0   Protein, UA Positive (A) Negative   Urobilinogen, UA negative (A) 0.2 or 1.0 E.U./dL   Nitrite, UA Negative    Leukocytes, UA Negative Negative   Appearance     Odor    POCT urine pregnancy     Status: Normal   Collection Time: 08/04/23 11:35 AM  Result Value Ref Range   Preg Test,  Ur Negative Negative     Assessment/Plan:  1. Vaginal odor, abdominal pain, lower back pain  - POCT Urinalysis Dipstick, not consistent with UTI. Will send out for culture. - Chlamydia/Gonococcus/Trichomonas, NAA - results pending - POCT urine pregnancy, negative - POCT Wet Prep [facility]), consistent with BV (see below) - Urine Culture - pending  5. Bacterial vaginosis  - metroNIDAZOLE (FLAGYL) 500 MG tablet; Take 1 tablet (500 mg total) by mouth 2 (two) times daily for 7 days.  Dispense: 14 tablet; Refill: 0  Recommendation is for increasing hydration and start metronidazole. If still having back pain / abdomen pain after two days of increased hydration and metronidazole, I want her to come back in for reassessment and BMET.   Educated on metronidazole. Stressed the importance of not drinking on this medication.   She mentions neck pain as well - recommended some PT exercises for the neck.   General Counseling: teresita wojick understanding of the findings of todays visit and agrees with plan of treatment. I have discussed any further diagnostic evaluation that may be needed or ordered today. We also reviewed her medications today. she has been encouraged to call the office with any questions or concerns that should arise related to todays visit.   Orders Placed This Encounter  Procedures   Chlamydia/Gonococcus/Trichomonas, NAA   Urine Culture   POCT Urinalysis Dipstick   POCT urine pregnancy   POCT Wet Prep ([place])    Meds ordered this encounter  Medications   metroNIDAZOLE (FLAGYL) 500 MG tablet    Sig: Take 1 tablet (500 mg total) by mouth 2 (two) times daily for 7 days.    Dispense:  14 tablet    Refill:  0    Order Specific Question:   Supervising Provider    Answer:   Erasmo Downer [[id number]]      Signed, Lennon Alstrom, PA-C 08/04/2023, 1:01 PM

## 2023-08-05 LAB — CHLAMYDIA/GONOCOCCUS/TRICHOMONAS, NAA
Chlamydia by NAA: NEGATIVE
Gonococcus by NAA: NEGATIVE
Trich vag by NAA: NEGATIVE

## 2023-08-06 LAB — URINE CULTURE

## 2023-10-29 ENCOUNTER — Ambulatory Visit: Payer: BC Managed Care – PPO | Admitting: Medical
